# Patient Record
Sex: Female | Born: 1995 | Race: Black or African American | Hispanic: No | Marital: Single | Smoking: Current every day smoker
Health system: Southern US, Community
[De-identification: ages and names within clinical notes are randomized; demographics above are authoritative.]

## PROBLEM LIST (undated history)

## (undated) ENCOUNTER — Inpatient Hospital Stay (HOSPITAL_COMMUNITY): Payer: Self-pay

## (undated) ENCOUNTER — Emergency Department (HOSPITAL_COMMUNITY): Discharge status: Absent without leave | Payer: Self-pay

## (undated) ENCOUNTER — Emergency Department (HOSPITAL_COMMUNITY): Disposition: A | Discharge status: Home / Self Care | Payer: Self-pay

## (undated) DIAGNOSIS — O139 Gestational [pregnancy-induced] hypertension without significant proteinuria, unspecified trimester: Secondary | ICD-10-CM

## (undated) DIAGNOSIS — B999 Unspecified infectious disease: Secondary | ICD-10-CM

## (undated) DIAGNOSIS — F32A Depression, unspecified: Secondary | ICD-10-CM

## (undated) DIAGNOSIS — F329 Major depressive disorder, single episode, unspecified: Secondary | ICD-10-CM

## (undated) DIAGNOSIS — N390 Urinary tract infection, site not specified: Secondary | ICD-10-CM

## (undated) DIAGNOSIS — O1490 Unspecified pre-eclampsia, unspecified trimester: Secondary | ICD-10-CM

## (undated) HISTORY — PX: THERAPEUTIC ABORTION: SHX798

## (undated) HISTORY — PX: HERNIA REPAIR: SHX51

---
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
## 2014-01-14 ENCOUNTER — Encounter (HOSPITAL_COMMUNITY): Payer: Self-pay | Admitting: Emergency Medicine

## 2014-01-14 ENCOUNTER — Emergency Department (HOSPITAL_COMMUNITY): Payer: BC Managed Care – PPO

## 2014-01-14 ENCOUNTER — Emergency Department (HOSPITAL_COMMUNITY)
Admission: EM | Admit: 2014-01-14 | Discharge: 2014-01-15 | Disposition: A | Discharge status: Home / Self Care | Payer: BC Managed Care – PPO | Attending: Emergency Medicine | Admitting: Emergency Medicine

## 2014-01-14 DIAGNOSIS — F17200 Nicotine dependence, unspecified, uncomplicated: Secondary | ICD-10-CM | POA: Insufficient documentation

## 2014-01-14 DIAGNOSIS — Z880 Allergy status to penicillin: Secondary | ICD-10-CM | POA: Diagnosis not present

## 2014-01-14 DIAGNOSIS — R071 Chest pain on breathing: Secondary | ICD-10-CM | POA: Insufficient documentation

## 2014-01-14 DIAGNOSIS — R079 Chest pain, unspecified: Secondary | ICD-10-CM | POA: Diagnosis present

## 2014-01-14 DIAGNOSIS — R0789 Other chest pain: Secondary | ICD-10-CM

## 2014-01-14 LAB — CBC
HCT: 38 % (ref 36.0–46.0)
Hemoglobin: 12.7 g/dL (ref 12.0–15.0)
MCH: 29.1 pg (ref 26.0–34.0)
MCHC: 33.4 g/dL (ref 30.0–36.0)
MCV: 87 fL (ref 78.0–100.0)
Platelets: 232 10*3/uL (ref 150–400)
RBC: 4.37 MIL/uL (ref 3.87–5.11)
RDW: 13.5 % (ref 11.5–15.5)
WBC: 12 10*3/uL — ABNORMAL HIGH (ref 4.0–10.5)

## 2014-01-14 LAB — BASIC METABOLIC PANEL
BUN: 11 mg/dL (ref 6–23)
CALCIUM: 9.4 mg/dL (ref 8.4–10.5)
CO2: 22 mEq/L (ref 19–32)
Chloride: 103 mEq/L (ref 96–112)
Creatinine, Ser: 0.61 mg/dL (ref 0.50–1.10)
GFR calc Af Amer: >90 mL/min (ref >90)
Glucose, Bld: 94 mg/dL (ref 70–99)
Potassium: 4 mEq/L (ref 3.7–5.3)
SODIUM: 139 meq/L (ref 137–147)

## 2014-01-14 LAB — I-STAT TROPONIN, ED: Troponin i, poc: 0.01 ng/mL (ref 0.00–0.08)
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
## 2014-01-15 DIAGNOSIS — R071 Chest pain on breathing: Secondary | ICD-10-CM | POA: Diagnosis not present

## 2014-01-15 MED ORDER — IBUPROFEN 800 MG PO TABS
800.0000 mg | ORAL_TABLET | Freq: Once | ORAL | Status: AC
Start: 2014-01-15 — End: 2014-01-15
  Administered 2014-01-15: 800 mg via ORAL
  Filled 2014-01-15: qty 1

## 2014-01-15 MED ORDER — IBUPROFEN 800 MG PO TABS
800.0000 mg | ORAL_TABLET | Freq: Three times a day (TID) | ORAL | Status: DC | PRN
Start: 2014-01-15 — End: 2014-03-02

## 2014-01-15 NOTE — ED Provider Notes (Signed)
CSN: [id number]     Arrival date & time 01/14/14  2143 History   First MD Initiated Contact with Patient 01/14/14 2359     Chief Complaint  Patient presents with  . Chest Pain     (Consider location/radiation/quality/duration/timing/severity/associated sxs/prior Treatment) HPI 18 year old female presents to emergency room with complaint of central chest pain starting last night/this morning.  Pain is sharp, stabbing in nature.  It is worse with movement and palpation of the area.  It is also worse with taking a deep breath.  She denies any leg swelling or pain, no exogenous hormones, no prolonged immobilization.  No other respects occurs for DVT or PE.  Patient reports she has pain to her upper legs, but attributes it to dancing last night. Past Medical History  Diagnosis Date  . Medical history non-contributory   . Pre-eclampsia    Past Surgical History  Procedure Laterality Date  . Hernia repair    . Cesarean section N/A 06/21/2013    Procedure: CESAREAN SECTION;  Surgeon: Purcell NailsAngela Y Roberts, MD;  Location: [facility];  Service: Obstetrics;  Laterality: N/A;   Family History  Problem Relation Age of Onset  . Diabetes Mother   . Hyperlipidemia Mother   . Asthma Mother   . Hyperlipidemia Father   . Hypertension Father   . Cancer Maternal Aunt     Rectal  . Cancer Maternal Grandmother   . Kidney disease Maternal Grandmother    History  Substance Use Topics  . Smoking status: Current Every Day Smoker -- 0.25 packs/day    Types: Cigarettes  . Smokeless tobacco: Never Used  . Alcohol Use: No     Comment: Occasional   OB History   Grav Para Term Preterm Abortions TAB SAB Ect Mult Living   2 2 1       2      Review of Systems  See History of Present Illness; otherwise all other systems are reviewed and negative   Allergies  Penicillins  Home Medications   Prior to Admission medications   Medication Sig Start Date End Date Taking? Authorizing Provider  ibuprofen  (ADVIL,MOTRIN) 800 MG tablet Take 1 tablet (800 mg total) by mouth every 8 (eight) hours as needed for moderate pain. 01/15/14   Olivia Mackielga M Yates Weisgerber, MD   BP 108/78  Pulse 70  Temp(Src) 98.5 F (36.9 C) (Oral)  Resp 22  Ht 5\' 5"  (1.651 m)  Wt 190 lb (86.183 kg)  BMI 31.62 kg/m2  SpO2 100%  LMP 12/19/2013  Breastfeeding? No Physical Exam  Nursing note and vitals reviewed. Constitutional: She is oriented to person, place, and time. She appears well-developed and well-nourished.  HENT:  Head: Normocephalic and atraumatic.  Nose: Nose normal.  Mouth/Throat: Oropharynx is clear and moist.  Eyes: Conjunctivae and EOM are normal. Pupils are equal, round, and reactive to light.  Neck: Normal range of motion. Neck supple. No JVD present. No tracheal deviation present. No thyromegaly present.  Cardiovascular: Normal rate, regular rhythm, normal heart sounds and intact distal pulses.  Exam reveals no gallop and no friction rub.   No murmur heard. Pulmonary/Chest: Effort normal and breath sounds normal. No stridor. No respiratory distress. She has no wheezes. She has no rales. She exhibits tenderness (tenderness to palpation over sternum and costochondral margins.  Palpation of this area reproduces the pain).  Abdominal: Soft. Bowel sounds are normal. She exhibits no distension and no mass. There is no tenderness. There is no rebound and no guarding.  Musculoskeletal: Normal range of motion. She exhibits no edema and no tenderness.  Lymphadenopathy:    She has no cervical adenopathy.  Neurological: She is alert and oriented to person, place, and time. She exhibits normal muscle tone. Coordination normal.  Skin: Skin is warm and dry. No rash noted. No erythema. No pallor.  Psychiatric: She has a normal mood and affect. Her behavior is normal. Judgment and thought content normal.    ED Course  Procedures (including critical care time) Labs Review Labs Reviewed  CBC - Abnormal; Notable for the  following:    WBC 12.0 (*)    All other components within normal limits  BASIC METABOLIC PANEL  I-STAT TROPOININ, ED    Imaging Review Dg Chest 2 View  01/14/2014   CLINICAL DATA:  Chest pain worsening in the past 24 hr.  EXAM: CHEST  2 VIEW  COMPARISON:  09/21/2013.  FINDINGS: The heart size and mediastinal contours are within normal limits. Both lungs are clear. The visualized skeletal structures are unremarkable.  IMPRESSION: No active cardiopulmonary disease.   Electronically Signed   By: Davonna Belling M.D.   On: 01/14/2014 23:18     EKG Interpretation   Date/Time:  Sunday January 14 2014 21:51:45 EDT Ventricular Rate:  93 PR Interval:  138 QRS Duration: 80 QT Interval:  346 QTC Calculation: 430 R Axis:   72 Text Interpretation:  Normal sinus rhythm Normal ECG Confirmed by Doren Kaspar   MD, Maylani Embree ([id number]) on 01/15/2014 12:17:44 AM      MDM   Final diagnoses:  Chest wall pain    18 year old female with chest wall pain.  Workup otherwise unremarkable.  Plan to treat with NSAIDs.  Patient is per negative.  Do not feel symptoms are due to PE.    Olivia Mackie, MD 01/15/14 [phone]

## 2014-01-15 NOTE — ED Notes (Signed)
Discharge instructions reviewed with pt. Pt verbalized understanding.   

## 2014-01-15 NOTE — Discharge Instructions (Signed)
Chest Wall Pain  Chest wall pain is pain in or around the bones and muscles of your chest. It may take up to 6 weeks to get better. It may take longer if you must stay physically active in your work and activities.   CAUSES   Chest wall pain may happen on its own. However, it may be caused by:  · A viral illness like the flu.  · Injury.  · Coughing.  · Exercise.  · Arthritis.  · Fibromyalgia.  · Shingles.  HOME CARE INSTRUCTIONS   · Avoid overtiring physical activity. Try not to strain or perform activities that cause pain. This includes any activities using your chest or your abdominal and side muscles, especially if heavy weights are used.  · Put ice on the sore area.  · Put ice in a plastic bag.  · Place a towel between your skin and the bag.  · Leave the ice on for 15-20 minutes per hour while awake for the first 2 days.  · Only take over-the-counter or prescription medicines for pain, discomfort, or fever as directed by your caregiver.  SEEK IMMEDIATE MEDICAL CARE IF:   · Your pain increases, or you are very uncomfortable.  · You have a fever.  · Your chest pain becomes worse.  · You have new, unexplained symptoms.  · You have nausea or vomiting.  · You feel sweaty or lightheaded.  · You have a cough with phlegm (sputum), or you cough up blood.  MAKE SURE YOU:   · Understand these instructions.  · Will watch your condition.  · Will get help right away if you are not doing well or get worse.  Document Released: 07/27/2005 Document Revised: 10/19/2011 Document Reviewed: 03/23/2011  ExitCare® Patient Information ©2014 [employer].    Costochondritis  Costochondritis is a condition in which the tissue (cartilage) that connects your ribs with your breastbone (sternum) becomes irritated. It causes pain in the chest and rib area. It usually goes away on its own over time.  HOME CARE  · Avoid activities that wear you out.  · Do not strain your ribs. Avoid activities that use your:  · Chest.  · Belly.  · Side  muscles.  · Put ice on the area for the first 2 days after the pain starts.  · Put ice in a plastic bag.  · Place a towel between your skin and the bag.  · Leave the ice on for 20 minutes, 2 3 times a day.  · Only take medicine as told by your doctor.  GET HELP IF:  · You have redness or puffiness (swelling) in the rib area.  · Your pain does not go away with rest or medicine.  GET HELP RIGHT AWAY IF:   · Your pain gets worse.  · You are very uncomfortable.  · You have trouble breathing.  · You cough up blood.  · You start sweating or throwing up (vomiting).  · You have a fever or lasting symptoms for more than 2 3 days.  · You have a fever and your symptoms suddenly get worse.  MAKE SURE YOU:   · Understand these instructions.  · Will watch your condition.  · Will get help right away if you are not doing well or get worse.  Document Released: 01/13/2008 Document Revised: 03/29/2013 Document Reviewed: 02/28/2013  ExitCare® Patient Information ©2014 [employer].

## 2014-01-15 NOTE — ED Notes (Signed)
Pt reports sternal cp described as sharp stabbing in nature. Pt reports pain started yesterday but got worse today. Pt states pain is worse with deep inhalation. Pt smokes cigarettes. Reports she did not take anything for her pain. Pt rates pain 8/10. Pt looks comfortable at this time, currently on phone.

## 2014-03-02 ENCOUNTER — Encounter (HOSPITAL_COMMUNITY): Payer: Self-pay | Admitting: Emergency Medicine

## 2014-03-02 ENCOUNTER — Emergency Department (HOSPITAL_COMMUNITY)
Admission: EM | Admit: 2014-03-02 | Discharge: 2014-03-02 | Disposition: A | Discharge status: Home / Self Care | Payer: BC Managed Care – PPO | Attending: Emergency Medicine | Admitting: Emergency Medicine

## 2014-03-02 DIAGNOSIS — N760 Acute vaginitis: Secondary | ICD-10-CM | POA: Diagnosis not present

## 2014-03-02 DIAGNOSIS — Z880 Allergy status to penicillin: Secondary | ICD-10-CM | POA: Insufficient documentation

## 2014-03-02 DIAGNOSIS — B9689 Other specified bacterial agents as the cause of diseases classified elsewhere: Secondary | ICD-10-CM | POA: Insufficient documentation

## 2014-03-02 DIAGNOSIS — Z3202 Encounter for pregnancy test, result negative: Secondary | ICD-10-CM | POA: Insufficient documentation

## 2014-03-02 DIAGNOSIS — F17200 Nicotine dependence, unspecified, uncomplicated: Secondary | ICD-10-CM | POA: Insufficient documentation

## 2014-03-02 DIAGNOSIS — A64 Unspecified sexually transmitted disease: Secondary | ICD-10-CM | POA: Insufficient documentation

## 2014-03-02 DIAGNOSIS — A499 Bacterial infection, unspecified: Secondary | ICD-10-CM | POA: Diagnosis not present

## 2014-03-02 LAB — URINALYSIS, ROUTINE W REFLEX MICROSCOPIC
BILIRUBIN URINE: NEGATIVE
Glucose, UA: NEGATIVE mg/dL
Hgb urine dipstick: NEGATIVE
KETONES UR: NEGATIVE mg/dL
Leukocytes, UA: NEGATIVE
Nitrite: NEGATIVE
PH: 7 (ref 5.0–8.0)
Protein, ur: NEGATIVE mg/dL
Specific Gravity, Urine: 1.018 (ref 1.005–1.030)
Urobilinogen, UA: 1 mg/dL (ref 0.0–1.0)

## 2014-03-02 LAB — WET PREP, GENITAL
Trich, Wet Prep: NONE SEEN
YEAST WET PREP: NONE SEEN

## 2014-03-02 LAB — POC URINE PREG, ED: Preg Test, Ur: NEGATIVE

## 2014-03-02 MED ORDER — METRONIDAZOLE 500 MG PO TABS: 500.0000 mg | ORAL_TABLET | Freq: Two times a day (BID) | ORAL | Status: DC

## 2014-03-02 NOTE — ED Notes (Signed)
Pt in vaginal burning and itching, thinks she has been exposed to an STD. No distress noted.

## 2014-03-02 NOTE — ED Notes (Signed)
Pt alert, NAD, calm, interactive, texting on cell phone at this time, [facility]

## 2014-03-02 NOTE — ED Notes (Signed)
Dr. Delo into room.  

## 2014-03-02 NOTE — ED Provider Notes (Signed)
CSN: [id number]     Arrival date & time 03/02/14  1649 History   First MD Initiated Contact with Patient 03/02/14 1943     Chief Complaint  Patient presents with  . SEXUALLY TRANSMITTED DISEASE     (Consider location/radiation/quality/duration/timing/severity/associated sxs/prior Treatment) HPI Comments: Patient is a [age] year old female who presents with complaints of vaginal discharge and vaginal burning. She is sexually active with one other partner in several times has not used protection. Her last menstrual period was last month and normal. She is concerned she may have a sexually transmitted disease.  Patient is a 18 y.o. female presenting with vaginal discharge. The history is provided by the patient.  Vaginal Discharge Quality:  Mucopurulent Severity:  Moderate Onset quality:  Sudden Duration:  2 days Timing:  Constant Progression:  Worsening Chronicity:  New Context: not after intercourse   Relieved by:  Nothing Worsened by:  Nothing tried Ineffective treatments:  None tried Associated symptoms: no abdominal pain     Past Medical History  Diagnosis Date  . Medical history non-contributory   . Pre-eclampsia    Past Surgical History  Procedure Laterality Date  . Hernia repair    . Cesarean section N/A 06/21/2013    Procedure: CESAREAN SECTION;  Surgeon: Purcell NailsAngela Y Roberts, MD;  Location: [facility];  Service: Obstetrics;  Laterality: N/A;   Family History  Problem Relation Age of Onset  . Diabetes Mother   . Hyperlipidemia Mother   . Asthma Mother   . Hyperlipidemia Father   . Hypertension Father   . Cancer Maternal Aunt     Rectal  . Cancer Maternal Grandmother   . Kidney disease Maternal Grandmother    History  Substance Use Topics  . Smoking status: Current Every Day Smoker -- 0.25 packs/day    Types: Cigarettes  . Smokeless tobacco: Never Used  . Alcohol Use: No     Comment: Occasional   OB History   Grav Para Term Preterm Abortions TAB SAB Ect Mult  Living   2 2 1       2      Review of Systems  Gastrointestinal: Negative for abdominal pain.  Genitourinary: Positive for vaginal discharge.  All other systems reviewed and are negative.     Allergies  Penicillins  Home Medications   Prior to Admission medications   Not on File   BP 112/79  Pulse 85  Temp(Src) 98.2 F (36.8 C) (Oral)  Resp 20  SpO2 100% Physical Exam  Nursing note and vitals reviewed. Constitutional: She is oriented to person, place, and time. She appears well-developed and well-nourished. No distress.  HENT:  Head: Normocephalic and atraumatic.  Mouth/Throat: Oropharynx is clear and moist.  Neck: Normal range of motion. Neck supple.  Abdominal: Soft. Bowel sounds are normal. She exhibits no distension. There is no tenderness.  Genitourinary:  There is a copious, whitish discharge present. There are no adnexal masses and no cervical motion tenderness. The external genitalia appears normal without lesions.  Musculoskeletal: Normal range of motion. She exhibits no edema.  Neurological: She is alert and oriented to person, place, and time.  Skin: Skin is warm and dry. She is not diaphoretic.    ED Course  Procedures (including critical care time) Labs Review Labs Reviewed  GC/CHLAMYDIA PROBE AMP  WET PREP, GENITAL  URINALYSIS, ROUTINE W REFLEX MICROSCOPIC  PREGNANCY, URINE  POC URINE PREG, ED    Imaging Review No results found.   EKG Interpretation None  MDM   Final diagnoses:  None    Pelvic exam reveals copious whitish discharge. Wet prep reveals many clue cells consistent with bacterial vaginosis. We'll treat with Flagyl. GC and Chlamydia cultures are pending. If these are positive we will call her and notify her of her need for further treatment.    Geoffery Lyons, MD 03/02/14 [phone]

## 2014-03-02 NOTE — Discharge Instructions (Signed)
Flagyl as prescribed. ° °We will call you if your cultures indicate that you require further treatment. ° ° °Bacterial Vaginosis °Bacterial vaginosis is a vaginal infection that occurs when the normal balance of bacteria in the vagina is disrupted. It results from an overgrowth of certain bacteria. This is the most common vaginal infection in women of childbearing age. Treatment is important to prevent complications, especially in pregnant women, as it can cause a premature delivery. °CAUSES  °Bacterial vaginosis is caused by an increase in harmful bacteria that are normally present in smaller amounts in the vagina. Several different kinds of bacteria can cause bacterial vaginosis. However, the reason that the condition develops is not fully understood. °RISK FACTORS °Certain activities or behaviors can put you at an increased risk of developing bacterial vaginosis, including: °· Having a new sex partner or multiple sex partners. °· Douching. °· Using an intrauterine device (IUD) for contraception. °Women do not get bacterial vaginosis from toilet seats, bedding, swimming pools, or contact with objects around them. °SIGNS AND SYMPTOMS  °Some women with bacterial vaginosis have no signs or symptoms. Common symptoms include: °· Grey vaginal discharge. °· A fishlike odor with discharge, especially after sexual intercourse. °· Itching or burning of the vagina and vulva. °· Burning or pain with urination. °DIAGNOSIS  °Your health care provider will take a medical history and examine the vagina for signs of bacterial vaginosis. A sample of vaginal fluid may be taken. Your health care provider will look at this sample under a microscope to check for bacteria and abnormal cells. A vaginal pH test may also be done.  °TREATMENT  °Bacterial vaginosis may be treated with antibiotic medicines. These may be given in the form of a pill or a vaginal cream. A second round of antibiotics may be prescribed if the condition comes back  after treatment.  °HOME CARE INSTRUCTIONS  °· Only take over-the-counter or prescription medicines as directed by your health care provider. °· If antibiotic medicine was prescribed, take it as directed. Make sure you finish it even if you start to feel better. °· Do not have sex until treatment is completed. °· Tell all sexual partners that you have a vaginal infection. They should see their health care provider and be treated if they have problems, such as a mild rash or itching. °· Practice safe sex by using condoms and only having one sex partner. °SEEK MEDICAL CARE IF:  °· Your symptoms are not improving after 3 days of treatment. °· You have increased discharge or pain. °· You have a fever. °MAKE SURE YOU:  °· Understand these instructions. °· Will watch your condition. °· Will get help right away if you are not doing well or get worse. °FOR MORE INFORMATION  °Centers for Disease Control and Prevention, Division of STD Prevention: www.cdc.gov/std °American Sexual Health Association (ASHA): www.ashastd.org  °Document Released: 07/27/2005 Document Revised: 05/17/2013 Document Reviewed: 03/08/2013 °ExitCare® Patient Information ©2015 [employer]. This information is not intended to replace advice given to you by your health care provider. Make sure you discuss any questions you have with your health care provider. ° °

## 2014-03-02 NOTE — ED Notes (Signed)
Pelvic Cart Set up (Ready)

## 2014-03-03 LAB — GC/CHLAMYDIA PROBE AMP
CT Probe RNA: NEGATIVE
GC Probe RNA: NEGATIVE

## 2014-03-29 ENCOUNTER — Emergency Department (HOSPITAL_COMMUNITY)
Admission: EM | Admit: 2014-03-29 | Discharge: 2014-03-29 | Disposition: A | Discharge status: Home / Self Care | Payer: BC Managed Care – PPO | Attending: Emergency Medicine | Admitting: Emergency Medicine

## 2014-03-29 ENCOUNTER — Encounter (HOSPITAL_COMMUNITY): Payer: Self-pay | Admitting: Emergency Medicine

## 2014-03-29 DIAGNOSIS — Z880 Allergy status to penicillin: Secondary | ICD-10-CM | POA: Insufficient documentation

## 2014-03-29 DIAGNOSIS — J029 Acute pharyngitis, unspecified: Secondary | ICD-10-CM | POA: Diagnosis not present

## 2014-03-29 DIAGNOSIS — R05 Cough: Secondary | ICD-10-CM | POA: Insufficient documentation

## 2014-03-29 DIAGNOSIS — R059 Cough, unspecified: Secondary | ICD-10-CM | POA: Insufficient documentation

## 2014-03-29 DIAGNOSIS — J3489 Other specified disorders of nose and nasal sinuses: Secondary | ICD-10-CM | POA: Diagnosis not present

## 2014-03-29 DIAGNOSIS — Z792 Long term (current) use of antibiotics: Secondary | ICD-10-CM | POA: Insufficient documentation

## 2014-03-29 DIAGNOSIS — F17200 Nicotine dependence, unspecified, uncomplicated: Secondary | ICD-10-CM | POA: Insufficient documentation

## 2014-03-29 LAB — RAPID STREP SCREEN (MED CTR MEBANE ONLY): Streptococcus, Group A Screen (Direct): NEGATIVE

## 2014-03-29 MED ORDER — HYDROCODONE-HOMATROPINE 5-1.5 MG/5ML PO SYRP: 5.0000 mL | ORAL_SOLUTION | Freq: Four times a day (QID) | ORAL | Status: DC | PRN

## 2014-03-29 NOTE — Discharge Instructions (Signed)
Please follow up with your primary care physician in 1-2 days. If you do not have one please call the [facility] number listed above. Please take pain medication and/or muscle relaxants as prescribed and as needed for pain. Please do not drive on narcotic pain medication or on muscle relaxants. Please read all discharge instructions and return precautions.  ° ° °Pharyngitis °Pharyngitis is redness, pain, and swelling (inflammation) of your pharynx.  °CAUSES  °Pharyngitis is usually caused by infection. Most of the time, these infections are from viruses (viral) and are part of a cold. However, sometimes pharyngitis is caused by bacteria (bacterial). Pharyngitis can also be caused by allergies. Viral pharyngitis may be spread from person to person by coughing, sneezing, and personal items or utensils (cups, forks, spoons, toothbrushes). Bacterial pharyngitis may be spread from person to person by more intimate contact, such as kissing.  °SIGNS AND SYMPTOMS  °Symptoms of pharyngitis include:   °· Sore throat.   °· Tiredness (fatigue).   °· Low-grade fever.   °· Headache. °· Joint pain and muscle aches. °· Skin rashes. °· Swollen lymph nodes. °· Plaque-like film on throat or tonsils (often seen with bacterial pharyngitis). °DIAGNOSIS  °Your health care provider will ask you questions about your illness and your symptoms. Your medical history, along with a physical exam, is often all that is needed to diagnose pharyngitis. Sometimes, a rapid strep test is done. Other lab tests may also be done, depending on the suspected cause.  °TREATMENT  °Viral pharyngitis will usually get better in 3-4 days without the use of medicine. Bacterial pharyngitis is treated with medicines that kill germs (antibiotics).  °HOME CARE INSTRUCTIONS  °· Drink enough water and fluids to keep your urine clear or pale yellow.   °· Only take over-the-counter or prescription medicines as directed by your health care provider:    °¨ If you are prescribed antibiotics, make sure you finish them even if you start to feel better.   °¨ Do not take aspirin.   °· Get lots of rest.   °· Gargle with 8 oz of salt water (½ tsp of salt per 1 qt of water) as often as every 1-2 hours to soothe your throat.   °· Throat lozenges (if you are not at risk for choking) or sprays may be used to soothe your throat. °SEEK MEDICAL CARE IF:  °· You have large, tender lumps in your neck. °· You have a rash. °· You cough up green, yellow-brown, or bloody spit. °SEEK IMMEDIATE MEDICAL CARE IF:  °· Your neck becomes stiff. °· You drool or are unable to swallow liquids. °· You vomit or are unable to keep medicines or liquids down. °· You have severe pain that does not go away with the use of recommended medicines. °· You have trouble breathing (not caused by a stuffy nose). °MAKE SURE YOU:  °· Understand these instructions. °· Will watch your condition. °· Will get help right away if you are not doing well or get worse. °Document Released: 07/27/2005 Document Revised: 05/17/2013 Document Reviewed: 04/03/2013 °ExitCare® Patient Information ©2015 [employer]. This information is not intended to replace advice given to you by your health care provider. Make sure you discuss any questions you have with your health care provider. ° °

## 2014-03-29 NOTE — ED Provider Notes (Signed)
Medical screening examination/treatment/procedure(s) were performed by non-physician practitioner and as supervising physician I was immediately available for consultation/collaboration.  Flint MelterElliott L Tracy Gerken, MD 03/29/14 [phone]

## 2014-03-29 NOTE — ED Provider Notes (Signed)
CSN: [id number]     Arrival date & time 03/29/14  0024 History   None    Chief Complaint  Patient presents with  . Sore Throat     (Consider location/radiation/quality/duration/timing/severity/associated sxs/prior Treatment) Patient is a 18 y.o. female presenting with pharyngitis.  Sore Throat This is a new problem. The current episode started yesterday. The problem occurs constantly. The problem has been unchanged. Associated symptoms include congestion, coughing and a sore throat. Pertinent negatives include no abdominal pain, anorexia, chest pain, chills, fatigue, fever, headaches, joint swelling, myalgias, nausea, neck pain or vomiting. The symptoms are aggravated by eating. She has tried acetaminophen for the symptoms. The treatment provided mild relief.    Past Medical History  Diagnosis Date  . Medical history non-contributory   . Pre-eclampsia    Past Surgical History  Procedure Laterality Date  . Hernia repair    . Cesarean section N/A 06/21/2013    Procedure: CESAREAN SECTION;  Surgeon: Purcell Nails, MD;  Location: [facility];  Service: Obstetrics;  Laterality: N/A;   Family History  Problem Relation Age of Onset  . Diabetes Mother   . Hyperlipidemia Mother   . Asthma Mother   . Hyperlipidemia Father   . Hypertension Father   . Cancer Maternal Aunt     Rectal  . Cancer Maternal Grandmother   . Kidney disease Maternal Grandmother    History  Substance Use Topics  . Smoking status: Current Every Day Smoker -- 0.25 packs/day    Types: Cigarettes  . Smokeless tobacco: Never Used  . Alcohol Use: No     Comment: Occasional   OB History   Grav Para Term Preterm Abortions TAB SAB Ect Mult Living   2 2 1       2      Review of Systems  Constitutional: Negative for fever, chills and fatigue.  HENT: Positive for congestion and sore throat.   Respiratory: Positive for cough.   Cardiovascular: Negative for chest pain.  Gastrointestinal: Negative for nausea,  vomiting, abdominal pain and anorexia.  Musculoskeletal: Negative for joint swelling, myalgias and neck pain.  Neurological: Negative for headaches.  All other systems reviewed and are negative.     Allergies  Penicillins  Home Medications   Prior to Admission medications   Medication Sig Start Date End Date Taking? Authorizing Provider  HYDROcodone-homatropine (HYCODAN) 5-1.5 MG/5ML syrup Take 5 mLs by mouth every 6 (six) hours as needed for cough. 03/29/14   Ife Vitelli L Toini Failla, PA-C  metroNIDAZOLE (FLAGYL) 500 MG tablet Take 1 tablet (500 mg total) by mouth 2 (two) times daily. One po bid x 7 days 03/02/14   Geoffery Lyons, MD   BP 136/90  Pulse 89  Temp(Src) 97.9 F (36.6 C) (Oral)  Resp 14  Ht 5\' 5"  (1.651 m)  Wt 179 lb (81.194 kg)  BMI 29.79 kg/m2  SpO2 100%  LMP 03/14/2014 Physical Exam  Nursing note and vitals reviewed. Constitutional: She is oriented to person, place, and time. She appears well-developed and well-nourished. No distress.  HENT:  Head: Normocephalic and atraumatic.  Right Ear: External ear normal.  Left Ear: External ear normal.  Nose: Nose normal.  Mouth/Throat: Uvula is midline and mucous membranes are normal. Posterior oropharyngeal erythema present. No oropharyngeal exudate, posterior oropharyngeal edema or tonsillar abscesses.  Eyes: Conjunctivae are normal.  Neck: Normal range of motion. Neck supple.  Cardiovascular: Normal rate, regular rhythm and normal heart sounds.   Pulmonary/Chest: Effort normal and breath sounds normal. No  respiratory distress.  Abdominal: Soft.  Musculoskeletal: Normal range of motion.  Lymphadenopathy:    She has cervical adenopathy.  Neurological: She is alert and oriented to person, place, and time.  Skin: Skin is warm and dry. She is not diaphoretic.  Psychiatric: She has a normal mood and affect.    ED Course  Procedures (including critical care time) Medications - No data to display  Labs Review Labs  Reviewed  RAPID STREP SCREEN  CULTURE, GROUP A STREP    Imaging Review No results found.   EKG Interpretation None      MDM   Final diagnoses:  Viral pharyngitis    Filed Vitals:   03/29/14 0333  BP: 136/90  Pulse: 89  Temp:   Resp: 14   Afebrile, NAD, non-toxic appearing, AAOx4.  Pt afebrile without tonsillar exudate, negative strep. Presents with mild cervical lymphadenopathy, & dysphagia; diagnosis of viral pharyngitis. No abx indicated. DC w symptomatic tx for pain  Pt does not appear dehydrated, but did discuss importance of water rehydration. Presentation non concerning for PTA or infxn spread to soft tissue. No trismus or uvula deviation. Specific return precautions discussed. Pt able to drink water in ED without difficulty with intact air way. Recommended PCP follow up.Patient is stable at time of discharge      Jeannetta EllisJennifer L Kassandra Meriweather, PA-C 03/29/14 [phone]

## 2014-03-29 NOTE — ED Notes (Signed)
Pt states she is leaving.  Informed pt that she is next to go to a treatment room and encouraged her to stay.  She states she will wait.

## 2014-03-29 NOTE — ED Notes (Signed)
Sore throat x 2 days; difficulty swallowing

## 2014-03-30 LAB — CULTURE, GROUP A STREP

## 2014-06-11 ENCOUNTER — Encounter (HOSPITAL_COMMUNITY): Payer: Self-pay | Admitting: Emergency Medicine

## 2014-06-25 ENCOUNTER — Encounter (HOSPITAL_COMMUNITY): Payer: Self-pay | Admitting: Emergency Medicine

## 2014-06-25 ENCOUNTER — Other Ambulatory Visit (HOSPITAL_COMMUNITY)
Admission: RE | Admit: 2014-06-25 | Discharge: 2014-06-25 | Disposition: A | Discharge status: Home / Self Care | Payer: BC Managed Care – PPO | Source: Ambulatory Visit | Attending: Family Medicine | Admitting: Family Medicine

## 2014-06-25 ENCOUNTER — Emergency Department (INDEPENDENT_AMBULATORY_CARE_PROVIDER_SITE_OTHER)
Admission: EM | Admit: 2014-06-25 | Discharge: 2014-06-25 | Disposition: A | Discharge status: Home / Self Care | Payer: BC Managed Care – PPO | Source: Home / Self Care | Attending: Family Medicine | Admitting: Family Medicine

## 2014-06-25 DIAGNOSIS — K625 Hemorrhage of anus and rectum: Secondary | ICD-10-CM

## 2014-06-25 DIAGNOSIS — N760 Acute vaginitis: Secondary | ICD-10-CM | POA: Insufficient documentation

## 2014-06-25 DIAGNOSIS — Z113 Encounter for screening for infections with a predominantly sexual mode of transmission: Secondary | ICD-10-CM | POA: Insufficient documentation

## 2014-06-25 LAB — POCT URINALYSIS DIP (DEVICE)
BILIRUBIN URINE: NEGATIVE
Glucose, UA: NEGATIVE mg/dL
Ketones, ur: NEGATIVE mg/dL
LEUKOCYTES UA: NEGATIVE
Nitrite: NEGATIVE
PH: 6 (ref 5.0–8.0)
Protein, ur: NEGATIVE mg/dL
Specific Gravity, Urine: >=1.03 (ref 1.005–1.030)
Urobilinogen, UA: 0.2 mg/dL (ref 0.0–1.0)

## 2014-06-25 LAB — POCT PREGNANCY, URINE: PREG TEST UR: NEGATIVE

## 2014-06-25 MED ORDER — METRONIDAZOLE 500 MG PO TABS: 500.0000 mg | ORAL_TABLET | Freq: Two times a day (BID) | ORAL | Status: DC

## 2014-06-25 NOTE — ED Notes (Signed)
C/o vaginal itching and rectal bleeding onset 4 days Reports rectal bleeding is only when she has a BM Sx also include abd pain and diarrhea x2 days LMP = 05/14/2014 Alert, no signs of acute distress.

## 2014-06-25 NOTE — ED Provider Notes (Addendum)
Natalie Dorothy PufferDanielle Chavez is a 18 y.o. female who presents to Urgent Care today for Vaginal irritation and rectal bleeding present for about 4 days. Clear discharge present. No fevers or chills nausea vomiting or diarrhea. No medications tried yet. Patient notes blood on the paper after having bowel movements. She's been having diarrhea recently and going to the bathroom a lot.   Past Medical History  Diagnosis Date  . Medical history non-contributory   . Pre-eclampsia    Past Surgical History  Procedure Laterality Date  . Hernia repair    . Cesarean section N/A 06/21/2013    Procedure: CESAREAN SECTION;  Surgeon: Purcell NailsAngela Y Roberts, MD;  Location: [facility];  Service: Obstetrics;  Laterality: N/A;   History  Substance Use Topics  . Smoking status: Current Every Day Smoker -- 0.25 packs/day    Types: Cigarettes  . Smokeless tobacco: Never Used  . Alcohol Use: No     Comment: Occasional   ROS as above Medications: No current facility-administered medications for this encounter.   Current Outpatient Prescriptions  Medication Sig Dispense Refill  . metroNIDAZOLE (FLAGYL) 500 MG tablet Take 1 tablet (500 mg total) by mouth 2 (two) times daily. One po bid x 7 days 14 tablet 0   Allergies  Allergen Reactions  . Penicillins Hives     Exam:  BP 110/78 mmHg  Pulse 80  Temp(Src) 98.2 F (36.8 C) (Oral)  Resp 14  SpO2 100%  LMP 05/14/2014 Gen: Well NAD HEENT: EOMI,  MMM Lungs: Normal work of breathing. CTABL Heart: RRR no MRG Abd: NABS, Soft. Nondistended, Nontender no rebound or guarding Exts: Brisk capillary refill, warm and well perfused. GYN:  Normal-appearing external genitalia. Vaginal canal with thin white discharge. Normal-appearing cervix. Rectum. Normal anus. No hemorrhoids. Nontender.  Results for orders placed or performed during the hospital encounter of 06/25/14 (from the past 24 hour(s))  POCT urinalysis dip (device)     Status: Abnormal   Collection Time: 06/25/14   6:41 PM  Result Value Ref Range   Glucose, UA NEGATIVE NEGATIVE mg/dL   Bilirubin Urine NEGATIVE NEGATIVE   Ketones, ur NEGATIVE NEGATIVE mg/dL   Specific Gravity, Urine >=1.030 1.005 - 1.030   Hgb urine dipstick TRACE (A) NEGATIVE   pH 6.0 5.0 - 8.0   Protein, ur NEGATIVE NEGATIVE mg/dL   Urobilinogen, UA 0.2 0.0 - 1.0 mg/dL   Nitrite NEGATIVE NEGATIVE   Leukocytes, UA NEGATIVE NEGATIVE  Pregnancy, urine POC     Status: None   Collection Time: 06/25/14  6:51 PM  Result Value Ref Range   Preg Test, Ur NEGATIVE NEGATIVE   No results found.  Assessment and Plan: 18 y.o. female with  1) vaginal discharge. Likely BV. Cytology pending treatment with metronidazole 2) active bleeding. Likely irritated rectal mucosa. Recommend follow-up with PCP. Recommended wet wipes.  Discussed warning signs or symptoms. Please see discharge instructions. Patient expresses understanding.     Rodolph BongEvan S Corey, MD 06/25/14 [phone]  Rodolph BongEvan S Corey, MD 06/25/14 [phone]

## 2014-06-25 NOTE — Discharge Instructions (Signed)
Thank you for coming in today. Take metronidazole twice daily Come back as needed Use wet wipes after bowel movements.  Follow-up with primary care provider Call or go to the emergency room if you get worse, have trouble breathing, have chest pains, or palpitations.    Bacterial Vaginosis Bacterial vaginosis is a vaginal infection that occurs when the normal balance of bacteria in the vagina is disrupted. It results from an overgrowth of certain bacteria. This is the most common vaginal infection in women of childbearing age. Treatment is important to prevent complications, especially in pregnant women, as it can cause a premature delivery. CAUSES  Bacterial vaginosis is caused by an increase in harmful bacteria that are normally present in smaller amounts in the vagina. Several different kinds of bacteria can cause bacterial vaginosis. However, the reason that the condition develops is not fully understood. RISK FACTORS Certain activities or behaviors can put you at an increased risk of developing bacterial vaginosis, including:  Having a new sex partner or multiple sex partners.  Douching.  Using an intrauterine device (IUD) for contraception. Women do not get bacterial vaginosis from toilet seats, bedding, swimming pools, or contact with objects around them. SIGNS AND SYMPTOMS  Some women with bacterial vaginosis have no signs or symptoms. Common symptoms include:  Grey vaginal discharge.  A fishlike odor with discharge, especially after sexual intercourse.  Itching or burning of the vagina and vulva.  Burning or pain with urination. DIAGNOSIS  Your health care provider will take a medical history and examine the vagina for signs of bacterial vaginosis. A sample of vaginal fluid may be taken. Your health care provider will look at this sample under a microscope to check for bacteria and abnormal cells. A vaginal pH test may also be done.  TREATMENT  Bacterial vaginosis may be  treated with antibiotic medicines. These may be given in the form of a pill or a vaginal cream. A second round of antibiotics may be prescribed if the condition comes back after treatment.  HOME CARE INSTRUCTIONS   Only take over-the-counter or prescription medicines as directed by your health care provider.  If antibiotic medicine was prescribed, take it as directed. Make sure you finish it even if you start to feel better.  Do not have sex until treatment is completed.  Tell all sexual partners that you have a vaginal infection. They should see their health care provider and be treated if they have problems, such as a mild rash or itching.  Practice safe sex by using condoms and only having one sex partner. SEEK MEDICAL CARE IF:   Your symptoms are not improving after 3 days of treatment.  You have increased discharge or pain.  You have a fever. MAKE SURE YOU:   Understand these instructions.  Will watch your condition.  Will get help right away if you are not doing well or get worse. FOR MORE INFORMATION  Centers for Disease Control and Prevention, Division of STD Prevention: SolutionApps.co.zawww.cdc.gov/std American Sexual Health Association (ASHA): www.ashastd.org  Document Released: 07/27/2005 Document Revised: 05/17/2013 Document Reviewed: 03/08/2013 [facility] Patient Information 2015 [place], [place]. This information is not intended to replace advice given to you by your health care provider. Make sure you discuss any questions you have with your health care provider.   Rectal Bleeding Rectal bleeding is when blood passes out of the anus. It is usually a sign that something is wrong. It may not be serious, but it should always be evaluated. Rectal bleeding may  present as bright red blood or extremely dark stools. The color may range from dark red or maroon to black (like tar). It is important that the cause of rectal bleeding be identified so treatment can be started and the problem  corrected. CAUSES   Hemorrhoids. These are enlarged (dilated) blood vessels or veins in the anal or rectal area.  Fistulas. Theseare abnormal, burrowing channels that usually run from inside the rectum to the skin around the anus. They can bleed.  Anal fissures. This is a tear in the tissue of the anus. Bleeding occurs with bowel movements.  Diverticulosis. This is a condition in which pockets or sacs project from the bowel wall. Occasionally, the sacs can bleed.  Diverticulitis. Thisis an infection involving diverticulosis of the colon.  Proctitis and colitis. These are conditions in which the rectum, colon, or both, can become inflamed and pitted (ulcerated).  Polyps and cancer. Polyps are non-cancerous (benign) growths in the colon that may bleed. Certain types of polyps turn into cancer.  Protrusion of the rectum. Part of the rectum can project from the anus and bleed.  Certain medicines.  Intestinal infections.  Blood vessel abnormalities. HOME CARE INSTRUCTIONS  Eat a high-fiber diet to keep your stool soft.  Limit activity.  Drink enough fluids to keep your urine clear or pale yellow.  Warm baths may be useful to soothe rectal pain.  Follow up with your caregiver as directed. SEEK IMMEDIATE MEDICAL CARE IF:  You develop increased bleeding.  You have black or dark red stools.  You vomit blood or material that looks like coffee grounds.  You have abdominal pain or tenderness.  You have a fever.  You feel weak, nauseous, or you faint.  You have severe rectal pain or you are unable to have a bowel movement. MAKE SURE YOU:  Understand these instructions.  Will watch your condition.  Will get help right away if you are not doing well or get worse. Document Released: 01/16/2002 Document Revised: 10/19/2011 Document Reviewed: 01/11/2011 [facility]ExitCare Patient Information 2015 [place]inaiExitCare, [place]LL[place]. This information is not intended to replace advice given to you by your  health care provider. Make sure you discuss any questions you have with your health care provider.

## 2014-06-26 LAB — CERVICOVAGINAL ANCILLARY ONLY
Chlamydia: NEGATIVE
Neisseria Gonorrhea: NEGATIVE

## 2014-06-27 LAB — CERVICOVAGINAL ANCILLARY ONLY
WET PREP (BD AFFIRM): NEGATIVE
Wet Prep (BD Affirm): NEGATIVE
Wet Prep (BD Affirm): NEGATIVE

## 2014-09-05 DIAGNOSIS — Z720 Tobacco use: Secondary | ICD-10-CM | POA: Insufficient documentation

## 2014-09-05 DIAGNOSIS — N898 Other specified noninflammatory disorders of vagina: Secondary | ICD-10-CM | POA: Diagnosis present

## 2014-09-05 DIAGNOSIS — Z21 Asymptomatic human immunodeficiency virus [HIV] infection status: Secondary | ICD-10-CM | POA: Diagnosis not present

## 2014-09-06 ENCOUNTER — Encounter (HOSPITAL_COMMUNITY): Payer: Self-pay | Admitting: Emergency Medicine

## 2014-09-06 ENCOUNTER — Emergency Department (HOSPITAL_COMMUNITY)
Admission: EM | Admit: 2014-09-06 | Discharge: 2014-09-06 | Discharge status: Against Medical Advice | Payer: BC Managed Care – PPO | Attending: Emergency Medicine | Admitting: Emergency Medicine

## 2014-09-06 DIAGNOSIS — N898 Other specified noninflammatory disorders of vagina: Secondary | ICD-10-CM | POA: Diagnosis not present

## 2014-09-06 LAB — URINALYSIS, ROUTINE W REFLEX MICROSCOPIC
Bilirubin Urine: NEGATIVE
Glucose, UA: NEGATIVE mg/dL
Hgb urine dipstick: NEGATIVE
Ketones, ur: NEGATIVE mg/dL
Leukocytes, UA: NEGATIVE
Nitrite: NEGATIVE
Protein, ur: NEGATIVE mg/dL
Specific Gravity, Urine: 1.019 (ref 1.005–1.030)
Urobilinogen, UA: 1 mg/dL (ref 0.0–1.0)
pH: 6 (ref 5.0–8.0)

## 2014-09-06 LAB — PREGNANCY, URINE: Preg Test, Ur: NEGATIVE

## 2014-09-06 NOTE — ED Notes (Signed)
Called for pt in waiting room x 2; no answer

## 2014-09-06 NOTE — ED Notes (Signed)
Pt presents for evaluation of foul smelling vaginal discharge for the past week- also admits to pain with urination and rash to pelvic area.  Pt reports she recently was told one of her sexual partners was dx with HIV.  Denies abdominal pain.

## 2014-09-06 NOTE — ED Provider Notes (Signed)
Patient eloped from the emergency department before provider evaluation. I did not see or evaluate this patient. Triage note states patient presents for evaluation of foul-smelling vaginal discharge, painful urination rashes pelvic area. She also reports a history of HIV and one of her sexual partners. She denied abdominal pain.   Dahlia ClientHannah Tiaria Biby, PA-C 09/06/14 [phone]

## 2015-01-17 ENCOUNTER — Emergency Department (HOSPITAL_COMMUNITY)
Admission: EM | Admit: 2015-01-17 | Discharge: 2015-01-17 | Disposition: A | Discharge status: Home / Self Care | Payer: BC Managed Care – PPO | Attending: Emergency Medicine | Admitting: Emergency Medicine

## 2015-01-17 ENCOUNTER — Encounter (HOSPITAL_COMMUNITY): Payer: Self-pay | Admitting: *Deleted

## 2015-01-17 DIAGNOSIS — Z3202 Encounter for pregnancy test, result negative: Secondary | ICD-10-CM | POA: Insufficient documentation

## 2015-01-17 DIAGNOSIS — N3001 Acute cystitis with hematuria: Secondary | ICD-10-CM | POA: Diagnosis not present

## 2015-01-17 DIAGNOSIS — N938 Other specified abnormal uterine and vaginal bleeding: Secondary | ICD-10-CM | POA: Diagnosis present

## 2015-01-17 DIAGNOSIS — Z720 Tobacco use: Secondary | ICD-10-CM | POA: Insufficient documentation

## 2015-01-17 DIAGNOSIS — Z880 Allergy status to penicillin: Secondary | ICD-10-CM | POA: Insufficient documentation

## 2015-01-17 LAB — URINALYSIS, ROUTINE W REFLEX MICROSCOPIC
Bilirubin Urine: NEGATIVE
GLUCOSE, UA: NEGATIVE mg/dL
Ketones, ur: NEGATIVE mg/dL
Nitrite: NEGATIVE
Protein, ur: 100 mg/dL — AB
Specific Gravity, Urine: 1.023 (ref 1.005–1.030)
UROBILINOGEN UA: 1 mg/dL (ref 0.0–1.0)
pH: 7 (ref 5.0–8.0)

## 2015-01-17 LAB — POC URINE PREG, ED: Preg Test, Ur: NEGATIVE

## 2015-01-17 LAB — WET PREP, GENITAL
TRICH WET PREP: NONE SEEN
Yeast Wet Prep HPF POC: NONE SEEN

## 2015-01-17 LAB — URINE MICROSCOPIC-ADD ON

## 2015-01-17 MED ORDER — IBUPROFEN 800 MG PO TABS
800.0000 mg | ORAL_TABLET | Freq: Once | ORAL | Status: AC
  Administered 2015-01-17: 800 mg via ORAL
  Filled 2015-01-17: qty 1

## 2015-01-17 MED ORDER — LIDOCAINE HCL (PF) 1 % IJ SOLN
INTRAMUSCULAR | Status: AC
  Filled 2015-01-17: qty 5

## 2015-01-17 MED ORDER — CEPHALEXIN 500 MG PO CAPS: 500.0000 mg | ORAL_CAPSULE | Freq: Four times a day (QID) | ORAL | Status: DC

## 2015-01-17 MED ORDER — PHENAZOPYRIDINE HCL 200 MG PO TABS: 200.0000 mg | ORAL_TABLET | Freq: Three times a day (TID) | ORAL | Status: DC

## 2015-01-17 MED ORDER — CEFTRIAXONE SODIUM 1 G IJ SOLR
1.0000 g | Freq: Once | INTRAMUSCULAR | Status: AC
  Administered 2015-01-17: 1 g via INTRAMUSCULAR
  Filled 2015-01-17: qty 10

## 2015-01-17 MED ORDER — LIDOCAINE HCL (PF) 1 % IJ SOLN
2.0000 mL | Freq: Once | INTRAMUSCULAR | Status: AC
  Administered 2015-01-17: 2 mL via SUBCUTANEOUS

## 2015-01-17 NOTE — ED Provider Notes (Signed)
CSN: [id number]     Arrival date & time 01/17/15  1308 History   First MD Initiated Contact with Patient 01/17/15 1012     Chief Complaint  Patient presents with  . Abdominal Pain  . Vaginal Bleeding     (Consider location/radiation/quality/duration/timing/severity/associated sxs/prior Treatment) HPI   This is a 19 year old female who presents the emergency department with chief complaint of abdominal cramping and vaginal bleeding. She complains of pain which began 3 days ago. She states it was like her normal period cramps. Patient states that yesterday she began having heavy vaginal bleeding, which is abnormal for her. She states that her cramps have were extremely severe. She thought that she just had her regular period. Today she states that she began passing pink colored tissue from her vagina and she also has pain at the end of urination. She states "I am scared and I don't know what is going on and it hurts very badly." The patient has been in a monogamous relationship with her boyfriend for 5 years. She is not on birth control and does not use any condoms with intercourse. She states she her boyfriend get tested regularly for any STDs and they were  tested just last month. The patient denies any flank pain, vaginal symptoms, discharge, foul odor. She denies diarrhea, constipation.  Past Medical History  Diagnosis Date  . Medical history non-contributory   . Pre-eclampsia    Past Surgical History  Procedure Laterality Date  . Hernia repair    . Cesarean section N/A 06/21/2013    Procedure: CESAREAN SECTION;  Surgeon: Purcell Nails, MD;  Location: [facility];  Service: Obstetrics;  Laterality: N/A;   Family History  Problem Relation Age of Onset  . Diabetes Mother   . Hyperlipidemia Mother   . Asthma Mother   . Hyperlipidemia Father   . Hypertension Father   . Cancer Maternal Aunt     Rectal  . Cancer Maternal Grandmother   . Kidney disease Maternal Grandmother    History   Substance Use Topics  . Smoking status: Current Every Day Smoker -- 0.25 packs/day    Types: Cigarettes  . Smokeless tobacco: Never Used  . Alcohol Use: No     Comment: Occasional   OB History    Gravida Para Term Preterm AB TAB SAB Ectopic Multiple Living   Review of Systems  Ten systems reviewed and are negative for acute change, except as noted in the HPI.    Allergies  Penicillins  Home Medications   Prior to Admission medications   Medication Sig Start Date End Date Taking? Authorizing Provider  metroNIDAZOLE (FLAGYL) 500 MG tablet Take 1 tablet (500 mg total) by mouth 2 (two) times daily. One po bid x 7 days Patient not taking: Reported on 01/17/2015 06/25/14   Rodolph Bong, MD   BP 113/71 mmHg  Pulse 99  Temp(Src) 97.9 F (36.6 C) (Oral)  Resp 16  Ht  (1.651 m)  Wt 200 lb (90.719 kg)  BMI 33.28 kg/m2  SpO2 99%  LMP 12/21/2014 Physical Exam  Constitutional: She is oriented to person, place, and time. She appears well-developed and well-nourished. No distress.  HENT:  Head: Normocephalic and atraumatic.  Eyes: Conjunctivae are normal. No scleral icterus.  Neck: Normal range of motion.  Cardiovascular: Normal rate, regular rhythm and normal heart sounds.  Exam reveals no gallop and no friction rub.  No murmur heard. Pulmonary/Chest: Effort normal and breath sounds normal. No respiratory distress.  Abdominal: Soft. Bowel sounds are normal. She exhibits no distension and no mass. There is no tenderness. There is no guarding.  Genitourinary:  Pelvic exam: normal external genitalia, vulva, vagina, cervix, uterus and adnexa.   Neurological: She is alert and oriented to person, place, and time.  Skin: Skin is warm and dry. She is not diaphoretic.  Nursing note and vitals reviewed.   ED Course  Procedures (including critical care time) Labs Review Labs Reviewed  WET PREP, GENITAL - Abnormal; Notable for the following:    Clue Cells Wet  Prep HPF POC FEW (*)    WBC, Wet Prep HPF POC FEW (*)    All other components within normal limits  URINALYSIS, ROUTINE W REFLEX MICROSCOPIC (NOT AT [facility]) - Abnormal; Notable for the following:    APPearance TURBID (*)    Hgb urine dipstick LARGE (*)    Protein, ur 100 (*)    Leukocytes, UA LARGE (*)    All other components within normal limits  URINE MICROSCOPIC-ADD ON - Abnormal; Notable for the following:    Squamous Epithelial / LPF FEW (*)    All other components within normal limits  URINE CULTURE  POC URINE PREG, ED  GC/CHLAMYDIA PROBE AMP ([place]) NOT AT [facility]    Imaging Review No results found.   EKG Interpretation None      MDM   Final diagnoses:  Acute cystitis with hematuria   Normal vaginal exam Pt has been diagnosed with a UTI. Pt is afebrile, no CVA tenderness, normotensive, and denies N/V. Pt to be dc home with antibiotics and instructions to follow up with PCP if symptoms persist.     Arthor Captain, PA-C 01/17/15 1341  Gwyneth Sprout, MD 01/18/15 2218

## 2015-01-17 NOTE — Discharge Instructions (Signed)
You have been seen today for your complaint of pain with urination. °Your lab work showed urine infection. °Your discharge medications include: °1) Keflex °Please take all of your antibiotics until finished!   You may develop abdominal discomfort or diarrhea from the antibiotic.  You may help offset this with probiotics which you can buy or get in yogurt. Do not eat  or take the probiotics until 2 hours after your antibiotic.  °2) Pyridium °This medication will help relieve pain and burning but does not treat the infection.  Make sure that you wear a panty liner as it may stain your underwear. °Home care instructions are as follows:  °1) please drink plenty of water, avoid tea and beverages with caffeine like coffee or soda °2) if you are sexually active, ,make sure to urinate immediately after intercourse °Follow up with: your doctor or the emergency department °Please seek immediate medical care if you develop any of the following symptoms: °SEEK MEDICAL CARE IF:  °You have back pain.  °You develop a fever.  °Your symptoms do not begin to resolve within 3 days.  °SEEK IMMEDIATE MEDICAL CARE IF:  °You have severe back pain or lower abdominal pain.  °You develop chills.  °You have nausea or vomiting.  °You have continued burning or discomfort with urination. ° °

## 2015-01-17 NOTE — ED Notes (Signed)
Pt states lower cramping for several days, vaginal bleeding yesterday and burning with urination today.  LMP 5/13.

## 2015-01-18 LAB — GC/CHLAMYDIA PROBE AMP (~~LOC~~) NOT AT ARMC
Chlamydia: NEGATIVE
NEISSERIA GONORRHEA: NEGATIVE

## 2015-01-21 LAB — URINE CULTURE: Colony Count: >=100000

## 2015-01-22 ENCOUNTER — Telehealth: Payer: Self-pay | Admitting: Emergency Medicine

## 2015-01-22 NOTE — Telephone Encounter (Signed)
Post ED Visit - Positive Culture Follow-up  Culture report reviewed by antimicrobial stewardship pharmacist: []  Wes Dulaney, Pharm.D., BCPS [x]  Celedonio Miyamoto, Pharm.D., BCPS []  Georgina Pillion, Pharm.D., BCPS []  [place], [place].D., BCPS, AAHIVP []  Estella Husk, Pharm.D., BCPS, AAHIVP []  Elder Cyphers, [address].D., BCPS  Positive Urineculture Treated with Cephalexin, organism sensitive to the same and no further patient follow-up is required at this time.  Jiles Harold 01/22/2015, 3:07 PM

## 2015-01-27 ENCOUNTER — Inpatient Hospital Stay (HOSPITAL_COMMUNITY)
Admission: AD | Admit: 2015-01-27 | Discharge: 2015-01-28 | Disposition: A | Discharge status: Home / Self Care | Payer: BC Managed Care – PPO | Source: Ambulatory Visit | Attending: Obstetrics & Gynecology | Admitting: Obstetrics & Gynecology

## 2015-01-27 DIAGNOSIS — N760 Acute vaginitis: Secondary | ICD-10-CM | POA: Insufficient documentation

## 2015-01-27 DIAGNOSIS — F17210 Nicotine dependence, cigarettes, uncomplicated: Secondary | ICD-10-CM | POA: Insufficient documentation

## 2015-01-27 DIAGNOSIS — B9689 Other specified bacterial agents as the cause of diseases classified elsewhere: Secondary | ICD-10-CM

## 2015-01-27 HISTORY — DX: Depression, unspecified: F32.A

## 2015-01-27 HISTORY — DX: Major depressive disorder, single episode, unspecified: F32.9

## 2015-01-28 ENCOUNTER — Encounter (HOSPITAL_COMMUNITY): Payer: Self-pay | Admitting: *Deleted

## 2015-01-28 DIAGNOSIS — N760 Acute vaginitis: Secondary | ICD-10-CM | POA: Diagnosis not present

## 2015-01-28 DIAGNOSIS — A499 Bacterial infection, unspecified: Secondary | ICD-10-CM | POA: Diagnosis not present

## 2015-01-28 DIAGNOSIS — F17210 Nicotine dependence, cigarettes, uncomplicated: Secondary | ICD-10-CM | POA: Diagnosis not present

## 2015-01-28 DIAGNOSIS — N898 Other specified noninflammatory disorders of vagina: Secondary | ICD-10-CM | POA: Diagnosis present

## 2015-01-28 LAB — URINALYSIS, ROUTINE W REFLEX MICROSCOPIC
BILIRUBIN URINE: NEGATIVE
GLUCOSE, UA: NEGATIVE mg/dL
HGB URINE DIPSTICK: NEGATIVE
Ketones, ur: NEGATIVE mg/dL
LEUKOCYTES UA: NEGATIVE
NITRITE: NEGATIVE
PROTEIN: NEGATIVE mg/dL
Specific Gravity, Urine: 1.025 (ref 1.005–1.030)
UROBILINOGEN UA: 0.2 mg/dL (ref 0.0–1.0)
pH: 7 (ref 5.0–8.0)

## 2015-01-28 LAB — WET PREP, GENITAL
Clue Cells Wet Prep HPF POC: NONE SEEN
TRICH WET PREP: NONE SEEN
Yeast Wet Prep HPF POC: NONE SEEN

## 2015-01-28 LAB — POCT PREGNANCY, URINE: Preg Test, Ur: NEGATIVE

## 2015-01-28 MED ORDER — FLUCONAZOLE 150 MG PO TABS: 150.0000 mg | ORAL_TABLET | Freq: Every day | ORAL | Status: DC

## 2015-01-28 MED ORDER — METRONIDAZOLE 500 MG PO TABS: 500.0000 mg | ORAL_TABLET | Freq: Two times a day (BID) | ORAL | Status: DC

## 2015-01-28 NOTE — MAU Note (Signed)
LMP 5/17; did not take pregnancy test at home but wants one here. Last seen at [facility] 1 year ago.  Wants to be checked for STDs. Vaginal discharge with foul odor x 1 week; yellow & thick. Vaginal irritation.

## 2015-01-28 NOTE — MAU Provider Note (Signed)
History     CSN: [id number]  Arrival date and time: 01/27/15 2358   First Provider Initiated Contact with Patient 01/28/15 [phone]      Chief Complaint  Patient presents with  . Possible Pregnancy  . Exposure to STD   HPI [name] is a 19 y.o. G1P1001 who presents to MAU today with complaint of vaginal discharge and odor. The patient states a thick, yellow-green malodorous discharge noted x 2 weeks. She denies vaginal itching, irritation, pelvic pain, fever or UTI symptoms. She was seen at [facility] on 01/17/15 with the same complaint. GC/Chlamdyia was negative. Wet prep showed few clues. Patient was not treated for BV but was given Keflex for possible UTI. She states that she took some of the medication, but not as directed.   OB History    Gravida Para Term Preterm AB TAB SAB Ectopic Multiple Living   1 1 1       1       Past Medical History  Diagnosis Date  . Medical history non-contributory   . Pre-eclampsia   . Depression     post partum depression for two weeks    Past Surgical History  Procedure Laterality Date  . Hernia repair    . Cesarean section N/A 06/21/2013    Procedure: CESAREAN SECTION;  Surgeon: Purcell Nails, MD;  Location: [facility];  Service: Obstetrics;  Laterality: N/A;    Family History  Problem Relation Age of Onset  . Diabetes Mother   . Hyperlipidemia Mother   . Asthma Mother   . Hyperlipidemia Father   . Hypertension Father   . Cancer Maternal Aunt     Rectal  . Cancer Maternal Grandmother   . Kidney disease Maternal Grandmother     History  Substance Use Topics  . Smoking status: Current Every Day Smoker -- 0.25 packs/day    Types: Cigarettes  . Smokeless tobacco: Never Used  . Alcohol Use: No     Comment: Occasional    Allergies:  Allergies  Allergen Reactions  . Penicillins Hives    Prescriptions prior to admission  Medication Sig Dispense Refill Last Dose  . cephALEXin (KEFLEX) 500 MG capsule Take 1 capsule (500  mg total) by mouth 4 (four) times daily. 20 capsule 0 01/24/2015 at Unknown time  . phenazopyridine (PYRIDIUM) 200 MG tablet Take 1 tablet (200 mg total) by mouth 3 (three) times daily. 6 tablet 0 Past Week at Unknown time    Review of Systems  Constitutional: Negative for fever and malaise/fatigue.  Gastrointestinal: Negative for abdominal pain.  Genitourinary: Negative for dysuria, urgency and frequency.       + vaginal discharge Neg - vaginal bleeding   Physical Exam   Blood pressure 125/73, pulse 83, temperature 98.7 F (37.1 C), resp. rate 16, height 5\' 5"  (1.651 m), weight 197 lb 9.6 oz (89.631 kg), last menstrual period 12/25/2014, SpO2 100 %, not currently breastfeeding.  Physical Exam  Nursing note and vitals reviewed. Constitutional: She is oriented to person, place, and time. She appears well-developed and well-nourished. No distress.  HENT:  Head: Normocephalic and atraumatic.  Cardiovascular: Normal rate.   Respiratory: Effort normal.  GI: Soft. She exhibits no distension and no mass. There is no tenderness. There is no rebound and no guarding.  Genitourinary: Uterus is not enlarged and not tender. Cervix exhibits no motion tenderness, no discharge and no friability. Right adnexum displays no mass and no tenderness. Left adnexum displays no mass and  no tenderness. No bleeding in the vagina. Vaginal discharge (small amount of thin, white, malodorous discharge) found.  Neurological: She is alert and oriented to person, place, and time.  Skin: Skin is warm and dry. No erythema.  Psychiatric: She has a normal mood and affect.   Results for orders placed or performed during the hospital encounter of 01/27/15 (from the past 24 hour(s))  Urinalysis, Routine w reflex microscopic (not at [facility])     Status: None   Collection Time: 01/28/15 12:09 AM  Result Value Ref Range   Color, Urine YELLOW YELLOW   APPearance CLEAR CLEAR   Specific Gravity, Urine 1.025 1.005 - 1.030   pH 7.0  5.0 - 8.0   Glucose, UA NEGATIVE NEGATIVE mg/dL   Hgb urine dipstick NEGATIVE NEGATIVE   Bilirubin Urine NEGATIVE NEGATIVE   Ketones, ur NEGATIVE NEGATIVE mg/dL   Protein, ur NEGATIVE NEGATIVE mg/dL   Urobilinogen, UA 0.2 0.0 - 1.0 mg/dL   Nitrite NEGATIVE NEGATIVE   Leukocytes, UA NEGATIVE NEGATIVE  Pregnancy, urine POC     Status: None   Collection Time: 01/28/15 12:24 AM  Result Value Ref Range   Preg Test, Ur NEGATIVE NEGATIVE  Wet prep, genital     Status: Abnormal   Collection Time: 01/28/15  1:00 AM  Result Value Ref Range   Yeast Wet Prep HPF POC NONE SEEN NONE SEEN   Trich, Wet Prep NONE SEEN NONE SEEN   Clue Cells Wet Prep HPF POC NONE SEEN NONE SEEN   WBC, Wet Prep HPF POC FEW (A) NONE SEEN    MAU Course  Procedures None  MDM UPT - negative UA, wet prep Patient requests Diflucan Rx if antibiotics are required.  Assessment and Plan  A: Bacterial vaginosis, clinical  P: Discharge home Rx for Flagyl and Diflucan given to patient Patient advised to follow-up with OB/Gyn of choice for routine health maintenance Patient may return to MAU as needed or if her condition were to change or worsen   Marny Lowenstein, PA-C  01/28/2015, 1:31 AM

## 2015-01-28 NOTE — Discharge Instructions (Signed)
Bacterial Vaginosis Bacterial vaginosis is a vaginal infection that occurs when the normal balance of bacteria in the vagina is disrupted. It results from an overgrowth of certain bacteria. This is the most common vaginal infection in women of childbearing age. Treatment is important to prevent complications, especially in pregnant women, as it can cause a premature delivery. CAUSES  Bacterial vaginosis is caused by an increase in harmful bacteria that are normally present in smaller amounts in the vagina. Several different kinds of bacteria can cause bacterial vaginosis. However, the reason that the condition develops is not fully understood. RISK FACTORS Certain activities or behaviors can put you at an increased risk of developing bacterial vaginosis, including:  Having a new sex partner or multiple sex partners.  Douching.  Using an intrauterine device (IUD) for contraception. Women do not get bacterial vaginosis from toilet seats, bedding, swimming pools, or contact with objects around them. SIGNS AND SYMPTOMS  Some women with bacterial vaginosis have no signs or symptoms. Common symptoms include:  Grey vaginal discharge.  A fishlike odor with discharge, especially after sexual intercourse.  Itching or burning of the vagina and vulva.  Burning or pain with urination. DIAGNOSIS  Your health care provider will take a medical history and examine the vagina for signs of bacterial vaginosis. A sample of vaginal fluid may be taken. Your health care provider will look at this sample under a microscope to check for bacteria and abnormal cells. A vaginal pH test may also be done.  TREATMENT  Bacterial vaginosis may be treated with antibiotic medicines. These may be given in the form of a pill or a vaginal cream. A second round of antibiotics may be prescribed if the condition comes back after treatment.  HOME CARE INSTRUCTIONS   Only take over-the-counter or prescription medicines as  directed by your health care provider.  If antibiotic medicine was prescribed, take it as directed. Make sure you finish it even if you start to feel better.  Do not have sex until treatment is completed.  Tell all sexual partners that you have a vaginal infection. They should see their health care provider and be treated if they have problems, such as a mild rash or itching.  Practice safe sex by using condoms and only having one sex partner. SEEK MEDICAL CARE IF:   Your symptoms are not improving after 3 days of treatment.  You have increased discharge or pain.  You have a fever. MAKE SURE YOU:   Understand these instructions.  Will watch your condition.  Will get help right away if you are not doing well or get worse. FOR MORE INFORMATION  Centers for Disease Control and Prevention, Division of STD Prevention: www.cdc.gov/std American Sexual Health Association (ASHA): www.ashastd.org  Document Released: 07/27/2005 Document Revised: 05/17/2013 Document Reviewed: 03/08/2013 Exit[employer] Patient Information 2015 [employer]. This information is not intended to replace advice given to you by your health care provider. Make sure you discuss any questions you have with your health care provider.  

## 2015-01-28 NOTE — MAU Note (Signed)
Pt reports that she last took her antibiotic four days ago because she started to feel better but she still has pills in the container.  Discussed the importance of finishing all medication as prescribed.

## 2015-02-02 ENCOUNTER — Encounter (HOSPITAL_COMMUNITY): Payer: Self-pay | Admitting: Emergency Medicine

## 2015-02-02 ENCOUNTER — Emergency Department (INDEPENDENT_AMBULATORY_CARE_PROVIDER_SITE_OTHER)
Admission: EM | Admit: 2015-02-02 | Discharge: 2015-02-02 | Disposition: A | Discharge status: Home / Self Care | Payer: BC Managed Care – PPO | Source: Home / Self Care | Attending: Family Medicine | Admitting: Family Medicine

## 2015-02-02 DIAGNOSIS — N3001 Acute cystitis with hematuria: Secondary | ICD-10-CM

## 2015-02-02 LAB — POCT URINALYSIS DIP (DEVICE)
BILIRUBIN URINE: NEGATIVE
Glucose, UA: NEGATIVE mg/dL
Hgb urine dipstick: NEGATIVE
Ketones, ur: NEGATIVE mg/dL
Nitrite: NEGATIVE
PROTEIN: 100 mg/dL — AB
Specific Gravity, Urine: 1.025 (ref 1.005–1.030)
UROBILINOGEN UA: 0.2 mg/dL (ref 0.0–1.0)
pH: 6.5 (ref 5.0–8.0)

## 2015-02-02 LAB — POCT PREGNANCY, URINE: Preg Test, Ur: NEGATIVE

## 2015-02-02 MED ORDER — CIPROFLOXACIN HCL 500 MG PO TABS: 500.0000 mg | ORAL_TABLET | Freq: Two times a day (BID) | ORAL | Status: DC

## 2015-02-02 NOTE — Discharge Instructions (Signed)
Thank you for coming in today. Used birth control taking this medication. Take it for one week. Follow-up with your primary care doctor as needed.    Urinary Tract Infection Urinary tract infections (UTIs) can develop anywhere along your urinary tract. Your urinary tract is your body's drainage system for removing wastes and extra water. Your urinary tract includes two kidneys, two ureters, a bladder, and a urethra. Your kidneys are a pair of bean-shaped organs. Each kidney is about the size of your fist. They are located below your ribs, one on each side of your spine. CAUSES Infections are caused by microbes, which are microscopic organisms, including fungi, viruses, and bacteria. These organisms are so small that they can only be seen through a microscope. Bacteria are the microbes that most commonly cause UTIs. SYMPTOMS  Symptoms of UTIs may vary by age and gender of the patient and by the location of the infection. Symptoms in young women typically include a frequent and intense urge to urinate and a painful, burning feeling in the bladder or urethra during urination. Older women and men are more likely to be tired, shaky, and weak and have muscle aches and abdominal pain. A fever may mean the infection is in your kidneys. Other symptoms of a kidney infection include pain in your back or sides below the ribs, nausea, and vomiting. DIAGNOSIS To diagnose a UTI, your caregiver will ask you about your symptoms. Your caregiver also will ask to provide a urine sample. The urine sample will be tested for bacteria and white blood cells. White blood cells are made by your body to help fight infection. TREATMENT  Typically, UTIs can be treated with medication. Because most UTIs are caused by a bacterial infection, they usually can be treated with the use of antibiotics. The choice of antibiotic and length of treatment depend on your symptoms and the type of bacteria causing your infection. HOME CARE  INSTRUCTIONS  If you were prescribed antibiotics, take them exactly as your caregiver instructs you. Finish the medication even if you feel better after you have only taken some of the medication.  Drink enough water and fluids to keep your urine clear or pale yellow.  Avoid caffeine, tea, and carbonated beverages. They tend to irritate your bladder.  Empty your bladder often. Avoid holding urine for long periods of time.  Empty your bladder before and after sexual intercourse.  After a bowel movement, women should cleanse from front to back. Use each tissue only once. SEEK MEDICAL CARE IF:   You have back pain.  You develop a fever.  Your symptoms do not begin to resolve within 3 days. SEEK IMMEDIATE MEDICAL CARE IF:   You have severe back pain or lower abdominal pain.  You develop chills.  You have nausea or vomiting.  You have continued burning or discomfort with urination. MAKE SURE YOU:   Understand these instructions.  Will watch your condition.  Will get help right away if you are not doing well or get worse. Document Released: 05/06/2005 Document Revised: 01/26/2012 Document Reviewed: 09/04/2011 [facility] Patient Information 2015 [place], [place]. This information is not intended to replace advice given to you by your health care provider. Make sure you discuss any questions you have with your health care provider.

## 2015-02-02 NOTE — ED Provider Notes (Signed)
Natalie Chavez is a 19 y.o. female who presents to Urgent Care today for UTI. Patient developed urinary frequency urgency dysuria and hematuria the day or 2 ago. Her symptoms are consistent with UTI. She was diagnosed with a UTI on June 9. Urine culture showed intermediate sensitivity to cefazolin. She was treated with Keflex. She completed the partial course but did not finish the antibiotics. No vomiting or diarrhea. No current vaginal discharge.   Past Medical History  Diagnosis Date  . Medical history non-contributory   . Pre-eclampsia   . Depression     post partum depression for two weeks   Past Surgical History  Procedure Laterality Date  . Hernia repair    . Cesarean section N/A 06/21/2013    Procedure: CESAREAN SECTION;  Surgeon: Purcell Nails, MD;  Location: [facility];  Service: Obstetrics;  Laterality: N/A;   History  Substance Use Topics  . Smoking status: Current Every Day Smoker -- 0.25 packs/day    Types: Cigarettes  . Smokeless tobacco: Never Used  . Alcohol Use: No     Comment: Occasional   ROS as above Medications: No current facility-administered medications for this encounter.   Current Outpatient Prescriptions  Medication Sig Dispense Refill  . ciprofloxacin (CIPRO) 500 MG tablet Take 1 tablet (500 mg total) by mouth 2 (two) times daily. 14 tablet 0  . fluconazole (DIFLUCAN) 150 MG tablet Take 1 tablet (150 mg total) by mouth daily. 1 tablet 0  . metroNIDAZOLE (FLAGYL) 500 MG tablet Take 1 tablet (500 mg total) by mouth 2 (two) times daily. 14 tablet 0   Allergies  Allergen Reactions  . Penicillins Hives     Exam:  BP 105/75 mmHg  Pulse 98  Temp(Src) 99.3 F (37.4 C) (Oral)  Resp 16  SpO2 98%  LMP 12/25/2014 (Exact Date) Gen: Well NAD HEENT: EOMI,  MMM Lungs: Normal work of breathing. CTABL Heart: RRR no MRG Abd: NABS, Soft. Nondistended, Nontender no CV angle tenderness to percussion Exts: Brisk capillary refill, warm and well perfused.    Results for orders placed or performed during the hospital encounter of 02/02/15 (from the past 24 hour(s))  POCT urinalysis dip (device)     Status: Abnormal   Collection Time: 02/02/15  2:44 PM  Result Value Ref Range   Glucose, UA NEGATIVE NEGATIVE mg/dL   Bilirubin Urine NEGATIVE NEGATIVE   Ketones, ur NEGATIVE NEGATIVE mg/dL   Specific Gravity, Urine 1.025 1.005 - 1.030   Hgb urine dipstick NEGATIVE NEGATIVE   pH 6.5 5.0 - 8.0   Protein, ur 100 (A) NEGATIVE mg/dL   Urobilinogen, UA 0.2 0.0 - 1.0 mg/dL   Nitrite NEGATIVE NEGATIVE   Leukocytes, UA LARGE (A) NEGATIVE  Pregnancy, urine POC     Status: None   Collection Time: 02/02/15  2:47 PM  Result Value Ref Range   Preg Test, Ur NEGATIVE NEGATIVE   No results found.  Assessment and Plan: 19 y.o. female with UTI. Likely as result of partially treated bacteria that are intermediate resistant to cefazolin. Treat with Cipro is the shows good sensitivity to the bacteria. Culture pending.  Discussed warning signs or symptoms. Please see discharge instructions. Patient expresses understanding.     Rodolph Bong, MD 02/02/15 [phone]

## 2015-02-02 NOTE — ED Notes (Signed)
C/o uti States she has lower abd pain States she has pain when urinating Does have a little blood in urine

## 2015-02-05 ENCOUNTER — Encounter (HOSPITAL_COMMUNITY): Payer: Self-pay | Admitting: *Deleted

## 2015-02-05 ENCOUNTER — Inpatient Hospital Stay (HOSPITAL_COMMUNITY)
Admission: AD | Admit: 2015-02-05 | Discharge: 2015-02-05 | Disposition: A | Discharge status: Home / Self Care | Payer: BC Managed Care – PPO | Source: Ambulatory Visit | Attending: Family Medicine | Admitting: Family Medicine

## 2015-02-05 DIAGNOSIS — F17210 Nicotine dependence, cigarettes, uncomplicated: Secondary | ICD-10-CM | POA: Insufficient documentation

## 2015-02-05 DIAGNOSIS — N921 Excessive and frequent menstruation with irregular cycle: Secondary | ICD-10-CM | POA: Diagnosis not present

## 2015-02-05 DIAGNOSIS — N939 Abnormal uterine and vaginal bleeding, unspecified: Secondary | ICD-10-CM | POA: Diagnosis present

## 2015-02-05 DIAGNOSIS — N946 Dysmenorrhea, unspecified: Secondary | ICD-10-CM | POA: Insufficient documentation

## 2015-02-05 DIAGNOSIS — N920 Excessive and frequent menstruation with regular cycle: Secondary | ICD-10-CM | POA: Diagnosis not present

## 2015-02-05 HISTORY — DX: Urinary tract infection, site not specified: N39.0

## 2015-02-05 LAB — CBC
HCT: 39.5 % (ref 36.0–46.0)
Hemoglobin: 13.3 g/dL (ref 12.0–15.0)
MCH: 29.8 pg (ref 26.0–34.0)
MCHC: 33.7 g/dL (ref 30.0–36.0)
MCV: 88.4 fL (ref 78.0–100.0)
Platelets: 243 10*3/uL (ref 150–400)
RBC: 4.47 MIL/uL (ref 3.87–5.11)
RDW: 13.1 % (ref 11.5–15.5)
WBC: 11.5 10*3/uL — AB (ref 4.0–10.5)

## 2015-02-05 LAB — POCT PREGNANCY, URINE: PREG TEST UR: NEGATIVE

## 2015-02-05 MED ORDER — PHENAZOPYRIDINE HCL 100 MG PO TABS
200.0000 mg | ORAL_TABLET | Freq: Once | ORAL | Status: AC
  Administered 2015-02-05: 200 mg via ORAL
  Filled 2015-02-05: qty 2

## 2015-02-05 MED ORDER — PHENAZOPYRIDINE HCL 200 MG PO TABS: 200.0000 mg | ORAL_TABLET | Freq: Three times a day (TID) | ORAL | Status: DC | PRN

## 2015-02-05 MED ORDER — KETOROLAC TROMETHAMINE 10 MG PO TABS: 10.0000 mg | ORAL_TABLET | Freq: Four times a day (QID) | ORAL | Status: DC | PRN

## 2015-02-05 MED ORDER — MEDROXYPROGESTERONE ACETATE 10 MG PO TABS: 10.0000 mg | ORAL_TABLET | Freq: Every day | ORAL | Status: DC

## 2015-02-05 MED ORDER — IBUPROFEN 600 MG PO TABS
600.0000 mg | ORAL_TABLET | Freq: Once | ORAL | Status: AC
  Administered 2015-02-05: 600 mg via ORAL
  Filled 2015-02-05: qty 1

## 2015-02-05 NOTE — MAU Provider Note (Signed)
Chief Complaint: Vaginal Bleeding   First Provider Initiated Contact with Patient 02/05/15 1403     SUBJECTIVE HPI: Natalie Chavez is a [age] y.o. G1P1001 who presents to [facility] Admissions reporting Vaginal bleeding heavier than a normal menstrual period and cramping since today. Dx/Tx'd for 2 UTI's and BV in the past month. Started Cipro two days ago. Sx of UTI improving, but still having dysuria. Last period two weeks ago was lighter and shorter than usual, but normal time. No Hx irreg cycels or heavy bleeding.   Pt is sexually active, no birth control. She states she is in a mutually monogamous relationship. GC/Chlamydia cultures negative 01/17/2015. Declines repeat testing.   Past Medical History  Diagnosis Date  . Medical history non-contributory   . Pre-eclampsia   . Depression     post partum depression for two weeks  . Urinary tract infection    OB History  Gravida Para Term Preterm AB SAB TAB Ectopic Multiple Living  1 1 1       1     # Outcome Date GA Lbr Len/2nd Weight Sex Delivery Anes PTL Lv  1 Term 06/21/13 [id number]  7 lb 9.7 oz (3.45 kg) M CS-LTranv EPI  Y     Past Surgical History  Procedure Laterality Date  . Hernia repair    . Cesarean section N/A 06/21/2013    Procedure: CESAREAN SECTION;  Surgeon: Purcell NailsAngela Y Roberts, MD;  Location: [facility];  Service: Obstetrics;  Laterality: N/A;   History   Social History  . Marital Status: Single    Spouse Name: N/A  . Number of Children: N/A  . Years of Education: N/A   Occupational History  . Not on file.   Social History Main Topics  . Smoking status: Current Every Day Smoker -- 0.25 packs/day    Types: Cigarettes  . Smokeless tobacco: Never Used  . Alcohol Use: No     Comment: Occasional  . Drug Use: No  . Sexual Activity: Yes    Birth Control/ Protection: None     Comment: last sex, 16 Jun 16    Other Topics Concern  . Not on file   Social History Narrative   No current facility-administered  medications on file prior to encounter.   Current Outpatient Prescriptions on File Prior to Encounter  Medication Sig Dispense Refill  . ciprofloxacin (CIPRO) 500 MG tablet Take 1 tablet (500 mg total) by mouth 2 (two) times daily. (Patient taking differently: Take 500 mg by mouth 2 (two) times daily. Pt started on 02/04/15 to take for 7 days.) 14 tablet 0  . metroNIDAZOLE (FLAGYL) 500 MG tablet Take 1 tablet (500 mg total) by mouth 2 (two) times daily. (Patient taking differently: Take 500 mg by mouth 2 (two) times daily. Pt started on 01/29/15 to take for 7 days.) 14 tablet 0  . fluconazole (DIFLUCAN) 150 MG tablet Take 1 tablet (150 mg total) by mouth daily. (Patient not taking: Reported on 02/05/2015) 1 tablet 0   Allergies  Allergen Reactions  . Penicillins Hives    Review of Systems  Constitutional: Negative for fever and chills.  Gastrointestinal: Positive for abdominal pain. Negative for nausea, vomiting, diarrhea and constipation.  Genitourinary: Negative for dysuria, urgency, frequency, hematuria and flank pain.       Positive for vaginal bleeding. Negative for vaginal discharge dyspareunia.  Neurological: Negative for dizziness and weakness.  Endo/Heme/Allergies: Does not bruise/bleed easily.    OBJECTIVE Blood pressure 123/68, pulse 95, temperature 98.5  F (36.9 C), temperature source Oral, resp. rate 16, height  (1.651 m), weight 196 lb (88.905 kg), last menstrual period 02/05/2015, SpO2 100 %, not currently breastfeeding. GENERAL: Well-developed, well-nourished female in no acute distress.  HEART: normal rate RESP: normal effort GI: Abdomen soft, non-tender. Positive bowel sounds 4. MS: Nontender, no edema NEURO: Alert and oriented GU EXAM: NEFG, small amount of bright red blood noted, cervix clean, cervix closed; uterus normal size, no adnexal tenderness or masses. Cervical motion tenderness. No CVA tenderness.  LAB RESULTS Results for orders placed or performed  during the hospital encounter of 02/05/15 (from the past 24 hour(s))  Pregnancy, urine POC     Status: None   Collection Time: 02/05/15  2:15 PM  Result Value Ref Range   Preg Test, Ur NEGATIVE NEGATIVE  CBC     Status: Abnormal   Collection Time: 02/05/15  2:28 PM  Result Value Ref Range   WBC 11.5 (H) 4.0 - 10.5 K/uL   RBC 4.47 3.87 - 5.11 MIL/uL   Hemoglobin 13.3 12.0 - 15.0 g/dL   HCT 10.2 72.5 - 36.6 %   MCV 88.4 78.0 - 100.0 fL   MCH 29.8 26.0 - 34.0 pg   MCHC 33.7 30.0 - 36.0 g/dL   RDW 44.0 34.7 - 42.5 %   Platelets 243 150 - 400 K/uL    IMAGING No results found.  MAU COURSE UPT, CBC, ibuprofen, Pyridium.  Pain improve significantly, but asking if she can have something stronger for home. Bleeding, hemoglobin stable.  ASSESSMENT 1. Menorrhagia with irregular cycle   2. Dysmenorrhea     PLAN Discharge home in stable condition. Comfort measures. Bleeding precautions. Rx Provera, Toradol and Pyridium.     Follow-up Information    Follow up with Gynecologist of your choice.   Why:  For menstrual problems and routine gynecology care      Follow up with [facility] [place] [facility]ERN[facility] ADMISS[facility].   Why:  As needed in emergencies   Contact information:   [address] [id number] mc [place] [address] [phone]       Medication List    TAKE these medications        ciprofloxacin 500 MG tablet  Commonly known as:  CIPRO  Take 1 tablet (500 mg total) by mouth 2 (two) times daily.     fluconazole 150 MG tablet  Commonly known as:  DIFLUCAN  Take 1 tablet (150 mg total) by mouth daily.     ketorolac 10 MG tablet  Commonly known as:  TORADOL  Take 1 tablet (10 mg total) by mouth every 6 (six) hours as needed.     medroxyPROGESTERone 10 MG tablet  Commonly known as:  PROVERA  Take 1 tablet (10 mg total) by mouth daily.     metroNIDAZOLE 500 MG tablet  Commonly known as:  FLAGYL  Take 1 tablet (500 mg total) by  mouth 2 (two) times daily.     phenazopyridine 200 MG tablet  Commonly known as:  PYRIDIUM  Take 1 tablet (200 mg total) by mouth 3 (three) times daily as needed for pain.       [place], CNM 02/05/2015  4:40 PM

## 2015-02-05 NOTE — Discharge Instructions (Signed)
Abnormal Uterine Bleeding Abnormal uterine bleeding can affect women at various stages in life, including teenagers, women in their reproductive years, pregnant women, and women who have reached menopause. Several kinds of uterine bleeding are considered abnormal, including:  Bleeding or spotting between periods.   Bleeding after sexual intercourse.   Bleeding that is heavier or more than normal.   Periods that last longer than usual.  Bleeding after menopause.  Many cases of abnormal uterine bleeding are minor and simple to treat, while others are more serious. Any type of abnormal bleeding should be evaluated by your health care provider. Treatment will depend on the cause of the bleeding. HOME CARE INSTRUCTIONS Monitor your condition for any changes. The following actions may help to alleviate any discomfort you are experiencing:  Avoid the use of tampons and douches as directed by your health care provider.  Change your pads frequently. You should get regular pelvic exams and Pap tests. Keep all follow-up appointments for diagnostic tests as directed by your health care provider.  SEEK MEDICAL CARE IF:   Your bleeding lasts more than 1 week.   You feel dizzy at times.  SEEK IMMEDIATE MEDICAL CARE IF:   You pass out.   You are changing pads every 15 to 30 minutes.   You have abdominal pain.  You have a fever.   You become sweaty or weak.   You are passing large blood clots from the vagina.   You start to feel nauseous and vomit. MAKE SURE YOU:   Understand these instructions.  Will watch your condition.  Will get help right away if you are not doing well or get worse. Document Released: 07/27/2005 Document Revised: 08/01/2013 Document Reviewed: 02/23/2013 [facility] Patient Information 2015 [place], [place]. This information is not intended to replace advice given to you by your health care provider. Make sure you discuss any questions you have with your  health care provider.  Dysmenorrhea Menstrual cramps (dysmenorrhea) are caused by the muscles of the uterus tightening (contracting) during a menstrual period. For some women, this discomfort is merely bothersome. For others, dysmenorrhea can be severe enough to interfere with everyday activities for a few days each month. Primary dysmenorrhea is menstrual cramps that last a couple of days when you start having menstrual periods or soon after. This often begins after a teenager starts having her period. As a woman gets older or has a baby, the cramps will usually lessen or disappear. Secondary dysmenorrhea begins later in life, lasts longer, and the pain may be stronger than primary dysmenorrhea. The pain may start before the period and last a few days after the period.  CAUSES  Dysmenorrhea is usually caused by an underlying problem, such as:  The tissue lining the uterus grows outside of the uterus in other areas of the body (endometriosis).  The endometrial tissue, which normally lines the uterus, is found in or grows into the muscular walls of the uterus (adenomyosis).  The pelvic blood vessels are engorged with blood just before the menstrual period (pelvic congestive syndrome).  Overgrowth of cells (polyps) in the lining of the uterus or cervix.  Falling down of the uterus (prolapse) because of loose or stretched ligaments.  Depression.  Bladder problems, infection, or inflammation.  Problems with the intestine, a tumor, or irritable bowel syndrome.  Cancer of the female organs or bladder.  A severely tipped uterus.  A very tight opening or closed cervix.  Noncancerous tumors of the uterus (fibroids).  Pelvic inflammatory disease (  PID).  Pelvic scarring (adhesions) from a previous surgery.  Ovarian cyst.  An intrauterine device (IUD) used for birth control. RISK FACTORS You may be at greater risk of dysmenorrhea if:  You are younger than age 20.  You started puberty  early.  You have irregular or heavy bleeding.  You have never given birth.  You have a family history of this problem.  You are a smoker. SIGNS AND SYMPTOMS   Cramping or throbbing pain in your lower abdomen.  Headaches.  Lower back pain.  Nausea or vomiting.  Diarrhea.  Sweating or dizziness.  Loose stools. DIAGNOSIS  A diagnosis is based on your history, symptoms, physical exam, diagnostic tests, or procedures. Diagnostic tests or procedures may include:  Blood tests.  Ultrasonography.  An examination of the lining of the uterus (dilation and curettage, D&C).  An examination inside your abdomen or pelvis with a scope (laparoscopy).  X-rays.  CT scan.  MRI.  An examination inside the bladder with a scope (cystoscopy).  An examination inside the intestine or stomach with a scope (colonoscopy, gastroscopy). TREATMENT  Treatment depends on the cause of the dysmenorrhea. Treatment may include:  Pain medicine prescribed by your health care provider.  Birth control pills or an IUD with progesterone hormone in it.  Hormone replacement therapy.  Nonsteroidal anti-inflammatory drugs (NSAIDs). These may help stop the production of prostaglandins.  Surgery to remove adhesions, endometriosis, ovarian cyst, or fibroids.  Removal of the uterus (hysterectomy).  Progesterone shots to stop the menstrual period.  Cutting the nerves on the sacrum that go to the female organs (presacral neurectomy).  Electric current to the sacral nerves (sacral nerve stimulation).  Antidepressant medicine.  Psychiatric therapy, counseling, or group therapy.  Exercise and physical therapy.  Meditation and yoga therapy.  Acupuncture. HOME CARE INSTRUCTIONS   Only take over-the-counter or prescription medicines as directed by your health care provider.  Place a heating pad or hot water bottle on your lower back or abdomen. Do not sleep with the heating pad.  Use aerobic  exercises, walking, swimming, biking, and other exercises to help lessen the cramping.  Massage to the lower back or abdomen may help.  Stop smoking.  Avoid alcohol and caffeine. SEEK MEDICAL CARE IF:   Your pain does not get better with medicine.  You have pain with sexual intercourse.  Your pain increases and is not controlled with medicines.  You have abnormal vaginal bleeding with your period.  You develop nausea or vomiting with your period that is not controlled with medicine. SEEK IMMEDIATE MEDICAL CARE IF:  You pass out.  Document Released: 07/27/2005 Document Revised: 03/29/2013 Document Reviewed: 01/12/2013 [facility] Patient Information 2015 [place]. This information is not intended to replace advice given to you by your health care provider. Make sure you discuss any questions you have with your health care provider.

## 2015-02-05 NOTE — MAU Note (Signed)
Long month. Had 2 UTI's and ? Bacterial infections.  Started on real strong antibiotics yesterday; started bleeding today.  Had not had period this month, had only been spotting.

## 2015-02-05 NOTE — MAU Note (Signed)
Urine in lab 

## 2015-02-06 ENCOUNTER — Encounter: Payer: Self-pay | Admitting: *Deleted

## 2015-02-06 LAB — HIV ANTIBODY (ROUTINE TESTING W REFLEX): HIV SCREEN 4TH GENERATION: NONREACTIVE

## 2015-02-06 LAB — URINE CULTURE: Special Requests: NORMAL

## 2015-02-07 ENCOUNTER — Telehealth (HOSPITAL_COMMUNITY): Payer: Self-pay

## 2015-02-07 ENCOUNTER — Telehealth (HOSPITAL_BASED_OUTPATIENT_CLINIC_OR_DEPARTMENT_OTHER): Payer: Self-pay | Admitting: Emergency Medicine

## 2015-02-07 NOTE — Telephone Encounter (Signed)
Post ED Visit - Positive Culture Follow-up  Culture report reviewed by antimicrobial stewardship pharmacist: []  Wes Dulaney, Pharm.D., BCPS []  Celedonio MiyamotoJeremy Frens, Pharm.D., BCPS []  Georgina PillionElizabeth Martin, [address] BCPS []  Amador PinesMinh Pham, [address]Ph[address]., BCPS, AAHIVP []  Estella HuskMichelle Turner, Pharm.D., BCPS, AAHIVP []  Elder CyphersLorie Poole, [address]Ph[address]D., BCPS Garvin FilaMike Maccia PharmD  Positive urine culture E. Coli and Proteus Mirabilis Treated with ciprofloxacin, organism sensitive to the same and no further patient follow-up is required at this time.  Berle MullMiller, Lanyiah Brix 02/07/2015, 12:01 PM

## 2015-09-22 ENCOUNTER — Inpatient Hospital Stay (HOSPITAL_COMMUNITY)
Admission: AD | Admit: 2015-09-22 | Discharge: 2015-09-22 | Disposition: A | Discharge status: Home / Self Care | Payer: Medicaid Other | Source: Ambulatory Visit | Attending: Obstetrics and Gynecology | Admitting: Obstetrics and Gynecology

## 2015-09-22 ENCOUNTER — Encounter (HOSPITAL_COMMUNITY): Payer: Self-pay | Admitting: *Deleted

## 2015-09-22 DIAGNOSIS — N760 Acute vaginitis: Secondary | ICD-10-CM | POA: Insufficient documentation

## 2015-09-22 DIAGNOSIS — F17210 Nicotine dependence, cigarettes, uncomplicated: Secondary | ICD-10-CM | POA: Insufficient documentation

## 2015-09-22 DIAGNOSIS — F329 Major depressive disorder, single episode, unspecified: Secondary | ICD-10-CM | POA: Diagnosis not present

## 2015-09-22 DIAGNOSIS — N898 Other specified noninflammatory disorders of vagina: Secondary | ICD-10-CM | POA: Diagnosis present

## 2015-09-22 DIAGNOSIS — B9689 Other specified bacterial agents as the cause of diseases classified elsewhere: Secondary | ICD-10-CM

## 2015-09-22 LAB — URINALYSIS, ROUTINE W REFLEX MICROSCOPIC
Bilirubin Urine: NEGATIVE
GLUCOSE, UA: NEGATIVE mg/dL
Hgb urine dipstick: NEGATIVE
Ketones, ur: NEGATIVE mg/dL
Leukocytes, UA: NEGATIVE
Nitrite: NEGATIVE
PH: 6.5 (ref 5.0–8.0)
Protein, ur: NEGATIVE mg/dL
Specific Gravity, Urine: 1.02 (ref 1.005–1.030)

## 2015-09-22 LAB — POCT PREGNANCY, URINE: Preg Test, Ur: NEGATIVE

## 2015-09-22 LAB — WET PREP, GENITAL
SPERM: NONE SEEN
Trich, Wet Prep: NONE SEEN
Yeast Wet Prep HPF POC: NONE SEEN

## 2015-09-22 MED ORDER — METRONIDAZOLE 0.75 % VA GEL: 1.0000 | Freq: Every day | VAGINAL | Status: AC

## 2015-09-22 MED ORDER — FLUCONAZOLE 150 MG PO TABS: 150.0000 mg | ORAL_TABLET | Freq: Every day | ORAL | Status: DC

## 2015-09-22 NOTE — Discharge Instructions (Signed)

## 2015-09-22 NOTE — Progress Notes (Signed)
Alphonzo Severance, CNM notified of pt arrival to MAU.  Notified that pt complains of foul smelling vaginal discharge and wants STD testing.  Provider states she is about to deliver another pt and will come see this pt when she is finished.

## 2015-09-22 NOTE — MAU Provider Note (Signed)
History    My Rinke, 20 yo, G1P1001, LMP  09/10/2015 presents to the MAU for vaginal discharge that "has a terrible smell".  Reports unprotected sex with the father of her baby 2 days ago. Desires STD screening.   Denies cramping, pelvic pain, or issues during urination.       Patient Active Problem List   Diagnosis Date Noted  . H/O cesarean section 06/22/2013  . History of pre-eclampsia 06/20/2013  . Drug overdose 06/18/2013    Chief Complaint  Patient presents with  . Vaginal Itching   HPI  OB History    Gravida Para Term Preterm AB TAB SAB Ectopic Multiple Living   Past Medical History  Diagnosis Date  . Medical history non-contributory   . Pre-eclampsia   . Depression     post partum depression for two weeks  . Urinary tract infection     Past Surgical History  Procedure Laterality Date  . Hernia repair    . Cesarean section N/A 06/21/2013    Procedure: CESAREAN SECTION;  Surgeon: Purcell Nails, MD;  Location: [facility];  Service: Obstetrics;  Laterality: N/A;    Family History  Problem Relation Age of Onset  . Diabetes Mother   . Hyperlipidemia Mother   . Asthma Mother   . Hyperlipidemia Father   . Hypertension Father   . Cancer Maternal Aunt     Rectal  . Cancer Maternal Grandmother   . Kidney disease Maternal Grandmother     Social History  Substance Use Topics  . Smoking status: Current Every Day Smoker -- 0.25 packs/day    Types: Cigarettes  . Smokeless tobacco: Never Used  . Alcohol Use: No     Comment: Occasional    Allergies:  Allergies  Allergen Reactions  . Penicillins Hives    Prescriptions prior to admission  Medication Sig Dispense Refill Last Dose  . ciprofloxacin (CIPRO) 500 MG tablet Take 1 tablet (500 mg total) by mouth 2 (two) times daily. (Patient taking differently: Take 500 mg by mouth 2 (two) times daily. Pt started on 02/04/15 to take for 7 days.) 14 tablet 0 02/04/2015 at Unknown time  .  fluconazole (DIFLUCAN) 150 MG tablet Take 1 tablet (150 mg total) by mouth daily. (Patient not taking: Reported on 02/05/2015) 1 tablet 0   . ketorolac (TORADOL) 10 MG tablet Take 1 tablet (10 mg total) by mouth every 6 (six) hours as needed. 20 tablet 0   . medroxyPROGESTERone (PROVERA) 10 MG tablet Take 1 tablet (10 mg total) by mouth daily. 10 tablet 0   . metroNIDAZOLE (FLAGYL) 500 MG tablet Take 1 tablet (500 mg total) by mouth 2 (two) times daily. (Patient taking differently: Take 500 mg by mouth 2 (two) times daily. Pt started on 01/29/15 to take for 7 days.) 14 tablet 0 02/04/2015 at Unknown time  . phenazopyridine (PYRIDIUM) 200 MG tablet Take 1 tablet (200 mg total) by mouth 3 (three) times daily as needed for pain. 12 tablet 0     ROS Physical Exam   Blood pressure 133/63, pulse 97, temperature 98.7 F (37.1 C), temperature source Oral, resp. rate 16, last menstrual period 09/10/2015.  Results for orders placed or performed during the hospital encounter of 09/22/15 (from the past 24 hour(s))  Urinalysis, Routine w reflex microscopic (not at [facility])     Status: None   Collection Time: 09/22/15  4:00  PM  Result Value Ref Range   Color, Urine YELLOW YELLOW   APPearance CLEAR CLEAR   Specific Gravity, Urine 1.020 1.005 - 1.030   pH 6.5 5.0 - 8.0   Glucose, UA NEGATIVE NEGATIVE mg/dL   Hgb urine dipstick NEGATIVE NEGATIVE   Bilirubin Urine NEGATIVE NEGATIVE   Ketones, ur NEGATIVE NEGATIVE mg/dL   Protein, ur NEGATIVE NEGATIVE mg/dL   Nitrite NEGATIVE NEGATIVE   Leukocytes, UA NEGATIVE NEGATIVE  Pregnancy, urine POC     Status: None   Collection Time: 09/22/15  4:07 PM  Result Value Ref Range   Preg Test, Ur NEGATIVE NEGATIVE  Wet prep, genital     Status: Abnormal   Collection Time: 09/22/15  4:40 PM  Result Value Ref Range   Yeast Wet Prep HPF POC NONE SEEN NONE SEEN   Trich, Wet Prep NONE SEEN NONE SEEN   Clue Cells Wet Prep HPF POC PRESENT (A) NONE SEEN   WBC, Wet Prep HPF  POC FEW (A) NONE SEEN   Sperm NONE SEEN       Physical Exam  Constitutional: She is oriented to person, place, and time. She appears well-developed.  HENT:  Head: Normocephalic.  Eyes: Pupils are equal, round, and reactive to light.  Neck: Normal range of motion.  Cardiovascular: Normal rate and regular rhythm.   Respiratory: Effort normal.  GI: Soft.  Genitourinary: Uterus is not tender. Cervix exhibits no discharge. Right adnexum displays no tenderness. Left adnexum displays no tenderness. No tenderness or bleeding in the vagina. Vaginal discharge found.  Musculoskeletal: Normal range of motion.  Neurological: She is alert and oriented to person, place, and time. She has normal reflexes.  Skin: Skin is warm and dry.  Psychiatric: She has a normal mood and affect. Her behavior is normal. Judgment and thought content normal.    ED Course  Assessment: Bacterial Vaginosis UPT negative CT/GC pending HIV pending RPR pending HepC pending    Plan: Dc home Rx metrogel x7 days Rx Diflucan x1  Will call with positive results for pending labs Call/ Follow up in office Prn if symptoms worsen    Alphonzo Severance CNM, MSN 09/22/2015 4:14 PM

## 2015-09-22 NOTE — Progress Notes (Signed)
Alphonzo Severance, CNM called to determine when discharge will be put in.  Provider states she is working on it.

## 2015-09-22 NOTE — Progress Notes (Signed)
Alphonzo Severance, CNM notified of wet prep results.  Provider states she will put in prescriptions and discharge orders.

## 2015-09-22 NOTE — MAU Note (Signed)
Pt states she is having vaginal itching and has a smell to the discharge.  Pt states she has no burning with urination.  Pt states she wants STD tests.

## 2015-09-23 LAB — GC/CHLAMYDIA PROBE AMP (~~LOC~~) NOT AT ARMC
Chlamydia: NEGATIVE
Neisseria Gonorrhea: NEGATIVE

## 2015-09-23 LAB — HIV ANTIBODY (ROUTINE TESTING W REFLEX): HIV Screen 4th Generation wRfx: NONREACTIVE

## 2015-09-23 LAB — RPR: RPR: NONREACTIVE

## 2015-09-23 LAB — HEPATITIS C ANTIBODY

## 2015-12-08 ENCOUNTER — Inpatient Hospital Stay (HOSPITAL_COMMUNITY)
Admission: AD | Admit: 2015-12-08 | Discharge: 2015-12-08 | Disposition: A | Discharge status: Home / Self Care | Payer: Medicaid Other | Source: Ambulatory Visit | Attending: Obstetrics and Gynecology | Admitting: Obstetrics and Gynecology

## 2015-12-08 ENCOUNTER — Encounter (HOSPITAL_COMMUNITY): Payer: Self-pay | Admitting: *Deleted

## 2015-12-08 DIAGNOSIS — B9689 Other specified bacterial agents as the cause of diseases classified elsewhere: Secondary | ICD-10-CM | POA: Insufficient documentation

## 2015-12-08 DIAGNOSIS — A499 Bacterial infection, unspecified: Secondary | ICD-10-CM

## 2015-12-08 DIAGNOSIS — N760 Acute vaginitis: Secondary | ICD-10-CM | POA: Diagnosis not present

## 2015-12-08 HISTORY — DX: Gestational (pregnancy-induced) hypertension without significant proteinuria, unspecified trimester: O13.9

## 2015-12-08 HISTORY — DX: Unspecified infectious disease: B99.9

## 2015-12-08 LAB — URINALYSIS, ROUTINE W REFLEX MICROSCOPIC
BILIRUBIN URINE: NEGATIVE
Glucose, UA: NEGATIVE mg/dL
Ketones, ur: NEGATIVE mg/dL
Leukocytes, UA: NEGATIVE
Nitrite: NEGATIVE
Protein, ur: NEGATIVE mg/dL
Specific Gravity, Urine: >1.03 — ABNORMAL HIGH (ref 1.005–1.030)
pH: 5.5 (ref 5.0–8.0)

## 2015-12-08 LAB — URINE MICROSCOPIC-ADD ON: RBC / HPF: NONE SEEN RBC/hpf (ref 0–5)

## 2015-12-08 LAB — WET PREP, GENITAL
SPERM: NONE SEEN
Trich, Wet Prep: NONE SEEN
Yeast Wet Prep HPF POC: NONE SEEN

## 2015-12-08 LAB — HIV ANTIBODY (ROUTINE TESTING W REFLEX): HIV Screen 4th Generation wRfx: NONREACTIVE

## 2015-12-08 LAB — POCT PREGNANCY, URINE: PREG TEST UR: NEGATIVE

## 2015-12-08 LAB — RPR: RPR: NONREACTIVE

## 2015-12-08 LAB — HEPATITIS B SURFACE ANTIGEN: HEP B S AG: NEGATIVE

## 2015-12-08 MED ORDER — METRONIDAZOLE 0.75 % VA GEL: 1.0000 | Freq: Every day | VAGINAL | Status: DC

## 2015-12-08 NOTE — MAU Provider Note (Signed)
Chief Complaint: Vaginal Discharge   None     SUBJECTIVE HPI: Natalie Chavez is a 20 y.o. G1P1001 at Unknown by LMP who presents to maternity admissions reporting vaginal discharge with odor and wants STD testing.  She reports frequent bacterial vaginosis and prefer topical treatment since she has trouble swallowing pills.  She denies any associated symptoms, and denies abdominal pain, vaginal bleeding, or fever/chills.  She denies vaginal itching/burning, urinary symptoms, h/a, dizziness, or n/v.   HPI  Past Medical History  Diagnosis Date  . Medical history non-contributory   . Pre-eclampsia   . Urinary tract infection   . Pregnancy induced hypertension   . Infection     UTI  . Depression     post partum depression for two weeks   Past Surgical History  Procedure Laterality Date  . Hernia repair    . Cesarean section N/A 06/21/2013    Procedure: CESAREAN SECTION;  Surgeon: Purcell Nails, MD;  Location: [facility];  Service: Obstetrics;  Laterality: N/A;   Social History   Social History  . Marital Status: Single    Spouse Name: N/A  . Number of Children: N/A  . Years of Education: N/A   Occupational History  . Not on file.   Social History Main Topics  . Smoking status: Current Every Day Smoker -- 0.25 packs/day for 1 years    Types: Cigarettes  . Smokeless tobacco: Never Used  . Alcohol Use: Yes     Comment: Occasional  . Drug Use: No  . Sexual Activity: Yes    Birth Control/ Protection: None, Condom     Comment: last sex, 16 Jun 16    Other Topics Concern  . Not on file   Social History Narrative   No current facility-administered medications on file prior to encounter.   Current Outpatient Prescriptions on File Prior to Encounter  Medication Sig Dispense Refill  . fluconazole (DIFLUCAN) 150 MG tablet Take 1 tablet (150 mg total) by mouth daily. (Patient not taking: Reported on 12/08/2015) 1 tablet 0   Allergies  Allergen Reactions  . Penicillins  Hives and Other (See Comments)    Has patient had a PCN reaction causing immediate rash, facial/tongue/throat swelling, SOB or lightheadedness with hypotension: No Has patient had a PCN reaction causing severe rash involving mucus membranes or skin necrosis: No Has patient had a PCN reaction that required hospitalization No Has patient had a PCN reaction occurring within the last 10 years: No If all of the above answers are "NO", then may proceed with Cephalosporin use.    ROS:  Review of Systems  Constitutional: Negative for fever, chills and fatigue.  Respiratory: Negative for shortness of breath.   Cardiovascular: Negative for chest pain.  Genitourinary: Positive for vaginal discharge. Negative for dysuria, flank pain, vaginal bleeding, difficulty urinating, vaginal pain and pelvic pain.  Neurological: Negative for dizziness and headaches.  Psychiatric/Behavioral: Negative.      I have reviewed patient's Past Medical Hx, Surgical Hx, Family Hx, Social Hx, medications and allergies.   Physical Exam  Patient Vitals for the past 24 hrs:  BP Temp Temp src Pulse Resp Weight  12/08/15 0732 114/75 mmHg 97.8 F (36.6 C) Oral 91 18 206 lb 4 oz (93.554 kg)   Constitutional: Well-developed, well-nourished female in no acute distress.  Cardiovascular: normal rate Respiratory: normal effort GI: Abd soft, non-tender. Pos BS x 4 MS: Extremities nontender, no edema, normal ROM Neurologic: Alert and oriented x 4.  GU:  Neg CVAT.  PELVIC EXAM: Cervix pink, visually closed, without lesion, small amount thin white malodorous discharge, vaginal walls and external genitalia normal Bimanual exam: Cervix 0/long/high, firm, anterior, neg CMT, uterus nontender, nonenlarged, adnexa without tenderness, enlargement, or mass   LAB RESULTS Results for orders placed or performed during the hospital encounter of 12/08/15 (from the past 24 hour(s))  Urinalysis, Routine w reflex microscopic ([facility])      Status: Abnormal   Collection Time: 12/08/15  7:30 AM  Result Value Ref Range   Color, Urine YELLOW YELLOW   APPearance CLEAR CLEAR   Specific Gravity, Urine >1.030 (H) 1.005 - 1.030   pH 5.5 5.0 - 8.0   Glucose, UA NEGATIVE NEGATIVE mg/dL   Hgb urine dipstick TRACE (A) NEGATIVE   Bilirubin Urine NEGATIVE NEGATIVE   Ketones, ur NEGATIVE NEGATIVE mg/dL   Protein, ur NEGATIVE NEGATIVE mg/dL   Nitrite NEGATIVE NEGATIVE   Leukocytes, UA NEGATIVE NEGATIVE  Urine microscopic-add on     Status: Abnormal   Collection Time: 12/08/15  7:30 AM  Result Value Ref Range   Squamous Epithelial / LPF TOO NUMEROUS TO COUNT (A) NONE SEEN   WBC, UA 6-30 0 - 5 WBC/hpf   RBC / HPF NONE SEEN 0 - 5 RBC/hpf   Bacteria, UA FEW (A) NONE SEEN   Urine-Other MUCOUS PRESENT   Pregnancy, urine POC     Status: None   Collection Time: 12/08/15  7:41 AM  Result Value Ref Range   Preg Test, Ur NEGATIVE NEGATIVE  Wet prep, genital     Status: Abnormal   Collection Time: 12/08/15  8:45 AM  Result Value Ref Range   Yeast Wet Prep HPF POC NONE SEEN NONE SEEN   Trich, Wet Prep NONE SEEN NONE SEEN   Clue Cells Wet Prep HPF POC PRESENT (A) NONE SEEN   WBC, Wet Prep HPF POC FEW (A) NONE SEEN   Sperm NONE SEEN        IMAGING No results found.  MAU Management/MDM: Ordered labs and reviewed results.  Clue cells present today, consistent with pt history and today's physical exam.  STD testing pending.  Consult Dr Richardson Doppole. Will treat for BV with Metrogel Q HS x 5 nights.  Pt stable at time of discharge.  ASSESSMENT 1. BV (bacterial vaginosis)     PLAN Discharge home GCC, RPR, HIV, Hep C pending    Medication List    STOP taking these medications        fluconazole 150 MG tablet  Commonly known as:  DIFLUCAN      TAKE these medications        acetaminophen 325 MG tablet  Commonly known as:  TYLENOL  Take 650 mg by mouth every 6 (six) hours as needed for moderate pain.     EXCEDRIN MIGRAINE 250-250-65  MG tablet  Generic drug:  aspirin-acetaminophen-caffeine  Take 1 tablet by mouth every 6 (six) hours as needed for headache.     metroNIDAZOLE 0.75 % vaginal gel  Commonly known as:  METROGEL  Place 1 Applicatorful vaginally at bedtime. Apply one applicatorful to vagina at bedtime for 5 days           Follow-up Information    Follow up with Purcell NailsOBERTS,ANGELA Y, MD.   Specialty:  Obstetrics and Gynecology   Why:  As needed, Return to MAU as needed for emergencies   Contact information:   [address] [place] [address] [phone]  Sharen Counter Certified Nurse-Midwife 12/08/2015  9:44 AM

## 2015-12-08 NOTE — MAU Note (Addendum)
Feel like she has a yeast or a bacterial infection.  "It's bad down there'.  Has a d/c and an odor and itching.  Would also like to have testing for other STD's and HIV.

## 2015-12-09 LAB — GC/CHLAMYDIA PROBE AMP (~~LOC~~) NOT AT ARMC
Chlamydia: NEGATIVE
NEISSERIA GONORRHEA: NEGATIVE

## 2016-02-25 DIAGNOSIS — F17210 Nicotine dependence, cigarettes, uncomplicated: Secondary | ICD-10-CM | POA: Insufficient documentation

## 2016-02-25 DIAGNOSIS — N72 Inflammatory disease of cervix uteri: Secondary | ICD-10-CM | POA: Insufficient documentation

## 2016-02-25 DIAGNOSIS — B9689 Other specified bacterial agents as the cause of diseases classified elsewhere: Secondary | ICD-10-CM | POA: Insufficient documentation

## 2016-02-25 DIAGNOSIS — N760 Acute vaginitis: Secondary | ICD-10-CM | POA: Insufficient documentation

## 2016-02-25 LAB — POC URINE PREG, ED: PREG TEST UR: NEGATIVE

## 2016-02-25 NOTE — ED Notes (Signed)
Per Pt she started having greenish discharge and irritation with foul odor; Pt is unsure if any sexual partner has STD; pt want to be checked for STD; Pt denies pain;  Pt states naueas started around the same time. LMP 01/31/16; Pt a&ox 4 on arrival.

## 2016-02-26 ENCOUNTER — Emergency Department (HOSPITAL_COMMUNITY)
Admission: EM | Admit: 2016-02-26 | Discharge: 2016-02-26 | Disposition: A | Discharge status: Home / Self Care | Payer: Medicaid Other | Attending: Emergency Medicine | Admitting: Emergency Medicine

## 2016-02-26 DIAGNOSIS — N760 Acute vaginitis: Secondary | ICD-10-CM

## 2016-02-26 DIAGNOSIS — N72 Inflammatory disease of cervix uteri: Secondary | ICD-10-CM

## 2016-02-26 DIAGNOSIS — B9689 Other specified bacterial agents as the cause of diseases classified elsewhere: Secondary | ICD-10-CM

## 2016-02-26 LAB — WET PREP, GENITAL
SPERM: NONE SEEN
Trich, Wet Prep: NONE SEEN
Yeast Wet Prep HPF POC: NONE SEEN

## 2016-02-26 LAB — URINALYSIS, ROUTINE W REFLEX MICROSCOPIC
Bilirubin Urine: NEGATIVE
GLUCOSE, UA: NEGATIVE mg/dL
Ketones, ur: NEGATIVE mg/dL
Leukocytes, UA: NEGATIVE
Nitrite: NEGATIVE
Protein, ur: NEGATIVE mg/dL
SPECIFIC GRAVITY, URINE: 1.027 (ref 1.005–1.030)
pH: 5.5 (ref 5.0–8.0)

## 2016-02-26 LAB — GC/CHLAMYDIA PROBE AMP (~~LOC~~) NOT AT ARMC
Chlamydia: NEGATIVE
Neisseria Gonorrhea: NEGATIVE

## 2016-02-26 LAB — URINE MICROSCOPIC-ADD ON

## 2016-02-26 MED ORDER — CEFTRIAXONE SODIUM 250 MG IJ SOLR
250.0000 mg | Freq: Once | INTRAMUSCULAR | Status: AC
  Administered 2016-02-26: 250 mg via INTRAMUSCULAR
  Filled 2016-02-26: qty 250

## 2016-02-26 MED ORDER — ONDANSETRON 4 MG PO TBDP
4.0000 mg | ORAL_TABLET | Freq: Once | ORAL | Status: AC
  Administered 2016-02-26: 4 mg via ORAL
  Filled 2016-02-26: qty 1

## 2016-02-26 MED ORDER — METRONIDAZOLE 0.75 % VA GEL: 1.0000 | Freq: Every day | VAGINAL | Status: DC

## 2016-02-26 MED ORDER — AZITHROMYCIN 250 MG PO TABS
1000.0000 mg | ORAL_TABLET | Freq: Once | ORAL | Status: AC
  Administered 2016-02-26: 1000 mg via ORAL
  Filled 2016-02-26: qty 4

## 2016-02-26 MED ORDER — STERILE WATER FOR INJECTION IJ SOLN
INTRAMUSCULAR | Status: AC
  Administered 2016-02-26: 10 mL
  Filled 2016-02-26: qty 10

## 2016-02-26 NOTE — Discharge Instructions (Signed)
Bacterial Vaginosis °Bacterial vaginosis is a vaginal infection that occurs when the normal balance of bacteria in the vagina is disrupted. It results from an overgrowth of certain bacteria. This is the most common vaginal infection in women of childbearing age. Treatment is important to prevent complications, especially in pregnant women, as it can cause a premature delivery. °CAUSES  °Bacterial vaginosis is caused by an increase in harmful bacteria that are normally present in smaller amounts in the vagina. Several different kinds of bacteria can cause bacterial vaginosis. However, the reason that the condition develops is not fully understood. °RISK FACTORS °Certain activities or behaviors can put you at an increased risk of developing bacterial vaginosis, including: °· Having a new sex partner or multiple sex partners. °· Douching. °· Using an intrauterine device (IUD) for contraception. °Women do not get bacterial vaginosis from toilet seats, bedding, swimming pools, or contact with objects around them. °SIGNS AND SYMPTOMS  °Some women with bacterial vaginosis have no signs or symptoms. Common symptoms include: °· Grey vaginal discharge. °· A fishlike odor with discharge, especially after sexual intercourse. °· Itching or burning of the vagina and vulva. °· Burning or pain with urination. °DIAGNOSIS  °Your health care provider will take a medical history and examine the vagina for signs of bacterial vaginosis. A sample of vaginal fluid may be taken. Your health care provider will look at this sample under a microscope to check for bacteria and abnormal cells. A vaginal pH test may also be done.  °TREATMENT  °Bacterial vaginosis may be treated with antibiotic medicines. These may be given in the form of a pill or a vaginal cream. A second round of antibiotics may be prescribed if the condition comes back after treatment. Because bacterial vaginosis increases your risk for sexually transmitted diseases, getting  treated can help reduce your risk for chlamydia, gonorrhea, HIV, and herpes. °HOME CARE INSTRUCTIONS  °· Only take over-the-counter or prescription medicines as directed by your health care provider. °· If antibiotic medicine was prescribed, take it as directed. Make sure you finish it even if you start to feel better. °· Tell all sexual partners that you have a vaginal infection. They should see their health care provider and be treated if they have problems, such as a mild rash or itching. °· During treatment, it is important that you follow these instructions: °¨ Avoid sexual activity or use condoms correctly. °¨ Do not douche. °¨ Avoid alcohol as directed by your health care provider. °¨ Avoid breastfeeding as directed by your health care provider. °SEEK MEDICAL CARE IF:  °· Your symptoms are not improving after 3 days of treatment. °· You have increased discharge or pain. °· You have a fever. °MAKE SURE YOU:  °· Understand these instructions. °· Will watch your condition. °· Will get help right away if you are not doing well or get worse. °FOR MORE INFORMATION  °Centers for Disease Control and Prevention, Division of STD Prevention: www.cdc.gov/std °American Sexual Health Association (ASHA): www.ashastd.org  °  °This information is not intended to replace advice given to you by your health care provider. Make sure you discuss any questions you have with your health care provider. °  °Document Released: 07/27/2005 Document Revised: 08/17/2014 Document Reviewed: 03/08/2013 °Elsevier Interactive Patient Education ©2016 [employer] ° °Cervicitis °Cervicitis is a soreness and swelling (inflammation) of the cervix. Your cervix is located at the bottom of your uterus. It opens up to the vagina. °CAUSES  °· Sexually transmitted infections (STIs).   °·   Allergic reaction.   °· Medicines or birth control devices that are put in the vagina.   °· Injury to the cervix.   °· Bacterial infections.   °RISK FACTORS °You are at  greater risk if you: °· Have unprotected sexual intercourse. °· Have sexual intercourse with many partners. °· Began sexual intercourse at an early age. °· Have a history of STIs. °SYMPTOMS  °There may be no symptoms. If symptoms occur, they may include:  °· Gray, white, yellow, or bad-smelling vaginal discharge.   °· Pain or itching of the area outside the vagina.   °· Painful sexual intercourse.   °· Lower abdominal or lower back pain, especially during intercourse.   °· Frequent urination.   °· Abnormal vaginal bleeding between periods, after sexual intercourse, or after menopause.   °· Pressure or a heavy feeling in the pelvis.   °DIAGNOSIS  °Diagnosis is made after a pelvic exam. Other tests may include:  °· Examination of any discharge under a microscope (wet prep).   °· A Pap test.   °TREATMENT  °Treatment will depend on the cause of cervicitis. If it is caused by an STI, both you and your partner will need to be treated. Antibiotic medicines will be given.  °HOME CARE INSTRUCTIONS  °· Do not have sexual intercourse until your health care provider says it is okay.   °· Do not have sexual intercourse until your partner has been treated, if your cervicitis is caused by an STI.   °· Take your antibiotics as directed. Finish them even if you start to feel better.   °SEEK MEDICAL CARE IF: °· Your symptoms come back.   °· You have a fever.   °MAKE SURE YOU:  °· Understand these instructions. °· Will watch your condition. °· Will get help right away if you are not doing well or get worse. °  °This information is not intended to replace advice given to you by your health care provider. Make sure you discuss any questions you have with your health care provider. °  °Document Released: 07/27/2005 Document Revised: 08/01/2013 Document Reviewed: 01/18/2013 °Elsevier Interactive Patient Education ©2016 [employer]vier Inc. ° °

## 2016-02-26 NOTE — ED Provider Notes (Signed)
CSN: [id number]     Arrival date & time 02/25/16  2207 History  By signing my name below, I, Natalie Chavez, attest that this documentation has been prepared under the direction and in the presence of Laurence Spatesachel Morgan Susanne Baumgarner, MD. Electronically Signed: Bethel BornBritney Chavez, ED Scribe. 02/26/2016. 4:15 AM  Chief Complaint  Patient presents with  . Emesis  . SEXUALLY TRANSMITTED DISEASE   The history is provided by the patient. No language interpreter was used.   Natalie Dorothy PufferDanielle Deshazer is a 20 y.o. female who presents to the Emergency Department complaining of yellow/green malodorous vaginal discharge with onset 1 week ago. Associated symptoms include nausea. Pt denies abdominal pain, vomiting, dysuria, difficulty urinating, or fevers. She last had unprotected sex 2 weeks ago. In the past she has been treated for bacterial vaginosis but denies douching and frequent bubble bathing. Previously she has had hives with penicillin.   Past Medical History  Diagnosis Date  . Medical history non-contributory   . Pre-eclampsia   . Urinary tract infection   . Pregnancy induced hypertension   . Infection     UTI  . Depression     post partum depression for two weeks   Past Surgical History  Procedure Laterality Date  . Hernia repair    . Cesarean section N/A 06/21/2013    Procedure: CESAREAN SECTION;  Surgeon: Purcell NailsAngela Y Roberts, MD;  Location: [facility];  Service: Obstetrics;  Laterality: N/A;   Family History  Problem Relation Age of Onset  . Diabetes Mother   . Hyperlipidemia Mother   . Asthma Mother   . Hypertension Mother   . Hyperlipidemia Father   . Hypertension Father   . Cancer Maternal Aunt     Rectal  . Hypertension Maternal Aunt   . Cancer Maternal Grandmother   . Kidney disease Maternal Grandmother    Social History  Substance Use Topics  . Smoking status: Current Every Day Smoker -- 0.25 packs/day for 1 years    Types: Cigarettes  . Smokeless tobacco: Never Used  . Alcohol Use: Yes      Comment: Occasional   OB History    Gravida Para Term Preterm AB TAB SAB Ectopic Multiple Living   1 1 1       1      Review of Systems  10 Systems reviewed and all are negative for acute change except as noted in the HPI.  Allergies  Penicillins  Home Medications   Prior to Admission medications   Medication Sig Start Date End Date Taking? Authorizing Provider  metroNIDAZOLE (METROGEL) 0.75 % vaginal gel Place 1 Applicatorful vaginally at bedtime. For 5 days 02/26/16   Ambrose Finlandachel Morgan Elizeth Weinrich, MD   BP 117/79 mmHg  Pulse 87  Temp(Src) 98.2 F (36.8 C) (Oral)  Resp 18  Ht 5\' 5"  (1.651 m)  Wt 212 lb (96.163 kg)  BMI 35.28 kg/m2  SpO2 100%  LMP 01/31/2016 Physical Exam  Constitutional: She is oriented to person, place, and time. She appears well-developed and well-nourished. No distress.  HENT:  Head: Normocephalic and atraumatic.  Moist mucous membranes  Eyes: Conjunctivae are normal. Pupils are equal, round, and reactive to light.  Neck: Neck supple.  Cardiovascular: Normal rate, regular rhythm and normal heart sounds.   No murmur heard. Pulmonary/Chest: Effort normal and breath sounds normal.  Abdominal: Soft. Bowel sounds are normal. She exhibits no distension. There is no tenderness.  Genitourinary:  Copious white, frothy, foul-smelling discharge in vaginal vault, no cervical motion or  adnexal tenderness  Musculoskeletal: She exhibits no edema.  Neurological: She is alert and oriented to person, place, and time.  Fluent speech  Skin: Skin is warm and dry.  Psychiatric: She has a normal mood and affect. Judgment normal.  Nursing note and vitals reviewed. Chaperone was present during exam.   ED Course  Procedures (including critical care time) DIAGNOSTIC STUDIES: Oxygen Saturation is 100% on RA,  normal by my interpretation.    COORDINATION OF CARE: 2:28 AM Discussed treatment plan which includes pelvic exam and lab work with pt at bedside and pt agreed to  plan.  Labs Review Labs Reviewed  WET PREP, GENITAL - Abnormal; Notable for the following:    Clue Cells Wet Prep HPF POC PRESENT (*)    WBC, Wet Prep HPF POC MANY (*)    All other components within normal limits  URINALYSIS, ROUTINE W REFLEX MICROSCOPIC (NOT AT [facility]) - Abnormal; Notable for the following:    APPearance CLOUDY (*)    Hgb urine dipstick TRACE (*)    All other components within normal limits  URINE MICROSCOPIC-ADD ON - Abnormal; Notable for the following:    Squamous Epithelial / LPF 6-30 (*)    Bacteria, UA MANY (*)    Casts HYALINE CASTS (*)    Crystals CA OXALATE CRYSTALS (*)    All other components within normal limits  POC URINE PREG, ED  GC/CHLAMYDIA PROBE AMP ([place]) NOT AT [facility]    Imaging Review No results found. I have personally reviewed and evaluated these lab results as part of my medical decision-making.   EKG Interpretation None      MDM   Final diagnoses:  Cervicitis  BV (bacterial vaginosis)   Patient with 1 week of foul-smelling vaginal discharge, recent unprotected sex. No abdominal tenderness, vomiting, or other infectious symptoms. She was well-appearing with normal vital signs. No abdominal tenderness. Large amount of white frothy discharge on pelvic exam, no tenderness to suggest PID. Wet prep shows clue cells. Provided with prescription for metronidazole gel per patient request. Because of her recent unprotected intercourse, also treated for STD with ceftriaxone and azithromycin. Return precautions reviewed. Provided with OB/GYN follow-up information for women's clinic. Patient voiced understanding and was discharged in satisfactory condition.  I personally performed the services described in this documentation, which was scribed in my presence. The recorded information has been reviewed and is accurate.   Laurence Spates, MD 02/26/16 [phone]

## 2016-05-15 ENCOUNTER — Emergency Department (HOSPITAL_COMMUNITY)
Admission: EM | Admit: 2016-05-15 | Discharge: 2016-05-15 | Disposition: A | Discharge status: Home / Self Care | Payer: Medicaid Other | Attending: Emergency Medicine | Admitting: Emergency Medicine

## 2016-05-15 ENCOUNTER — Encounter (HOSPITAL_COMMUNITY): Payer: Self-pay

## 2016-05-15 ENCOUNTER — Emergency Department (HOSPITAL_COMMUNITY): Payer: Medicaid Other

## 2016-05-15 DIAGNOSIS — J209 Acute bronchitis, unspecified: Secondary | ICD-10-CM | POA: Insufficient documentation

## 2016-05-15 DIAGNOSIS — F17210 Nicotine dependence, cigarettes, uncomplicated: Secondary | ICD-10-CM | POA: Insufficient documentation

## 2016-05-15 LAB — BASIC METABOLIC PANEL
ANION GAP: 10 (ref 5–15)
BUN: 13 mg/dL (ref 6–20)
CHLORIDE: 103 mmol/L (ref 101–111)
CO2: 22 mmol/L (ref 22–32)
Calcium: 9.3 mg/dL (ref 8.9–10.3)
Creatinine, Ser: 0.72 mg/dL (ref 0.44–1.00)
Glucose, Bld: 100 mg/dL — ABNORMAL HIGH (ref 65–99)
POTASSIUM: 4.1 mmol/L (ref 3.5–5.1)
SODIUM: 135 mmol/L (ref 135–145)

## 2016-05-15 LAB — CBC
HEMATOCRIT: 39.7 % (ref 36.0–46.0)
HEMOGLOBIN: 13.1 g/dL (ref 12.0–15.0)
MCH: 29.6 pg (ref 26.0–34.0)
MCHC: 33 g/dL (ref 30.0–36.0)
MCV: 89.8 fL (ref 78.0–100.0)
Platelets: 193 10*3/uL (ref 150–400)
RBC: 4.42 MIL/uL (ref 3.87–5.11)
RDW: 13 % (ref 11.5–15.5)
WBC: 8.6 10*3/uL (ref 4.0–10.5)

## 2016-05-15 LAB — I-STAT TROPONIN, ED: Troponin i, poc: 0 ng/mL (ref 0.00–0.08)

## 2016-05-15 MED ORDER — AZITHROMYCIN 250 MG PO TABS: 250.0000 mg | ORAL_TABLET | Freq: Every day | ORAL | 0 refills | Status: DC

## 2016-05-15 MED ORDER — AZITHROMYCIN 250 MG PO TABS
500.0000 mg | ORAL_TABLET | Freq: Once | ORAL | Status: AC
  Administered 2016-05-15: 500 mg via ORAL
  Filled 2016-05-15: qty 2

## 2016-05-15 MED ORDER — HYDROCODONE-HOMATROPINE 5-1.5 MG/5ML PO SYRP: 5.0000 mL | ORAL_SOLUTION | Freq: Four times a day (QID) | ORAL | 0 refills | Status: DC | PRN

## 2016-05-15 MED ORDER — PREDNISONE 20 MG PO TABS: 40.0000 mg | ORAL_TABLET | Freq: Every day | ORAL | 0 refills | Status: DC

## 2016-05-15 MED ORDER — PREDNISONE 20 MG PO TABS
40.0000 mg | ORAL_TABLET | Freq: Once | ORAL | Status: AC
  Administered 2016-05-15: 40 mg via ORAL
  Filled 2016-05-15: qty 2

## 2016-05-15 MED ORDER — ALBUTEROL SULFATE (2.5 MG/3ML) 0.083% IN NEBU
2.5000 mg | INHALATION_SOLUTION | Freq: Once | RESPIRATORY_TRACT | Status: AC
Start: 2016-05-15 — End: 2016-05-15
  Administered 2016-05-15: 2.5 mg via RESPIRATORY_TRACT
  Filled 2016-05-15: qty 3

## 2016-05-15 MED ORDER — ALBUTEROL SULFATE HFA 108 (90 BASE) MCG/ACT IN AERS
2.0000 | INHALATION_SPRAY | RESPIRATORY_TRACT | Status: DC | PRN
  Administered 2016-05-15: 2 via RESPIRATORY_TRACT
  Filled 2016-05-15: qty 6.7

## 2016-05-15 NOTE — ED Provider Notes (Signed)
[facility]-EMERGENCY DEPT Provider Note   CSN: [id number] Arrival date & time: 05/15/16  0351     [id number]   Chief Complaint Chief Complaint  Patient presents with  . Abdominal Pain  . Chest Pain    HPI Natalie Chavez is a 20 y.o. female.  Patient reports that she has been experiencing cough, nasal congestion, headache, chills for 2 days. Reports that her father is in the ICU currently and she has been visiting him continuously, thinks she might of been exposed to something here at the hospital. Patient reports the cough has been persistent and is worsening. She has soreness across her chest now with coughing and she reports that she coughs so violently that she gags. She is feeling like she cannot breathe well because her lungs are tight. She hasn't documented any fevers but has had chills and shakes tonight which was what brought her to the ER.      Past Medical History:  Diagnosis Date  . Depression    post partum depression for two weeks  . Infection    UTI  . Medical history non-contributory   . Pre-eclampsia   . Pregnancy induced hypertension   . Urinary tract infection     Patient Active Problem List   Diagnosis Date Noted  . H/O cesarean section 06/22/2013  . History of pre-eclampsia 06/20/2013  . Drug overdose 06/18/2013    Past Surgical History:  Procedure Laterality Date  . CESAREAN SECTION N/A 06/21/2013   Procedure: CESAREAN SECTION;  Surgeon: Purcell NailsAngela Y Roberts, MD;  Location: [facility];  Service: Obstetrics;  Laterality: N/A;  . HERNIA REPAIR      OB History    Gravida Para Term Preterm AB Living   2 1 1     1    SAB TAB Ectopic Multiple Live Births           1       Home Medications    Prior to Admission medications   Medication Sig Start Date End Date Taking? Authorizing Provider  ibuprofen (ADVIL,MOTRIN) 200 MG tablet Take 200 mg by mouth every 6 (six) hours as needed for mild pain.   Yes Historical Provider, MD  metroNIDAZOLE (METROGEL)  0.75 % vaginal gel Place 1 Applicatorful vaginally at bedtime. For 5 days Patient not taking: Reported on 05/15/2016 02/26/16   Laurence Spatesachel Morgan Little, MD    Family History Family History  Problem Relation Age of Onset  . Diabetes Mother   . Hyperlipidemia Mother   . Asthma Mother   . Hypertension Mother   . Hyperlipidemia Father   . Hypertension Father   . Cancer Maternal Grandmother   . Kidney disease Maternal Grandmother   . Cancer Maternal Aunt     Rectal  . Hypertension Maternal Aunt     Social History Social History  Substance Use Topics  . Smoking status: Current Every Day Smoker    Packs/day: 0.25    Years: 1.00    Types: Cigarettes  . Smokeless tobacco: Never Used  . Alcohol use Yes     Comment: Occasional     Allergies   Penicillins   Review of Systems Review of Systems  Constitutional: Positive for chills and fatigue.  HENT: Positive for congestion.   Respiratory: Positive for cough.   Cardiovascular: Positive for chest pain.  Musculoskeletal: Positive for myalgias.  All other systems reviewed and are negative.    Physical Exam Updated Vital Signs BP 113/74   Pulse 116   Temp  99.5 F (37.5 C) (Oral)   Resp 21   Ht 5\' 5"  (1.651 m)   Wt 206 lb (93.4 kg)   LMP 04/21/2016   SpO2 100%   BMI 34.28 kg/m   Physical Exam  Constitutional: She is oriented to person, place, and time. She appears well-developed and well-nourished. No distress.  HENT:  Head: Normocephalic and atraumatic.  Right Ear: Hearing normal.  Left Ear: Hearing normal.  Nose: Nose normal.  Mouth/Throat: Oropharynx is clear and moist and mucous membranes are normal.  Eyes: Conjunctivae and EOM are normal. Pupils are equal, round, and reactive to light.  Neck: Normal range of motion. Neck supple.  Cardiovascular: Regular rhythm, S1 normal and S2 normal.  Exam reveals no gallop and no friction rub.   No murmur heard. Pulmonary/Chest: Effort normal. No respiratory distress. She has  decreased breath sounds. She exhibits no tenderness.  Abdominal: Soft. Normal appearance and bowel sounds are normal. There is no hepatosplenomegaly. There is no tenderness. There is no rebound, no guarding, no tenderness at McBurney's point and negative Murphy's sign. No hernia.  Musculoskeletal: Normal range of motion.  Neurological: She is alert and oriented to person, place, and time. She has normal strength. No cranial nerve deficit or sensory deficit. Coordination normal. GCS eye subscore is 4. GCS verbal subscore is 5. GCS motor subscore is 6.  Skin: Skin is warm, dry and intact. No rash noted. No cyanosis.  Psychiatric: She has a normal mood and affect. Her speech is normal and behavior is normal. Thought content normal.  Nursing note and vitals reviewed.    ED Treatments / Results  Labs (all labs ordered are listed, but only abnormal results are displayed) Labs Reviewed  BASIC METABOLIC PANEL - Abnormal; Notable for the following:       Result Value   Glucose, Bld 100 (*)    All other components within normal limits  CBC  I-STAT TROPOININ, ED    EKG  EKG Interpretation  Date/Time:  Friday May 15 2016 04:08:45 EDT Ventricular Rate:  133 PR Interval:  120 QRS Duration: 80 QT Interval:  284 QTC Calculation: 422 R Axis:   81 Text Interpretation:  Sinus tachycardia Nonspecific T wave abnormality Abnormal ECG When compared with ECG of 01/14/2014, Nonspecific T wave abnormality is now Present Confirmed by [facility]  MD, DAVID ([id number]) on 05/15/2016 4:15:28 AM       Radiology Dg Chest 2 View  Result Date: 05/15/2016 CLINICAL DATA:  Cough, chest soreness and abdominal pain for 2 days. EXAM: CHEST  2 VIEW COMPARISON:  Chest radiograph January 14, 2014 FINDINGS: Cardiomediastinal silhouette is normal. No pleural effusions or focal consolidations. Trachea projects midline and there is no pneumothorax. Soft tissue planes and included osseous structures are non-suspicious. IMPRESSION:  Normal chest. Electronically Signed   By: Awilda Metro M.D.   On: 05/15/2016 04:45    Procedures Procedures (including critical care time)  Medications Ordered in ED Medications  albuterol (PROVENTIL) (2.5 MG/3ML) 0.083% nebulizer solution 2.5 mg (2.5 mg Nebulization Given 05/15/16 0526)     Initial Impression / Assessment and Plan / ED Course  I have reviewed the triage vital signs and the nursing notes.  Pertinent labs & imaging results that were available during my care of the patient were reviewed by me and considered in my medical decision making (see chart for details).  Clinical Course   Presents to the [facility] with 2 day history of progressively worsening cough, chest congestion, head congestion, chills  and flulike illness. Patient reports that she has had nausea and gagging, mostly secondary to response to her forceful coughing. She is now feeling soreness in her ribs and abdomen whenever she coughs. Chest x-ray does not show pneumonia. Lab work is unremarkable. Patient did have diminished breath sounds initially, was given a breathing treatment. She reports that she had significant symptomatic improvement with the breathing treatment and repeat examination revealed increased air movement. We'll continue bronchodilator therapy, prednisone, Zithromax.  Final Clinical Impressions(s) / ED Diagnoses   Final diagnoses:  Acute bronchitis, unspecified organism    New Prescriptions New Prescriptions   No medications on file     Gilda Crease, MD 05/15/16 [phone]

## 2016-05-15 NOTE — ED Triage Notes (Signed)
Pt states that she has had a cough for the past two days, c/o chest soreness and abd pain. Pt states she has been coughing so much it makes her gag, nonproductive.

## 2016-07-27 ENCOUNTER — Encounter (HOSPITAL_COMMUNITY): Payer: Self-pay | Admitting: *Deleted

## 2016-07-27 ENCOUNTER — Inpatient Hospital Stay (HOSPITAL_COMMUNITY)
Admission: AD | Admit: 2016-07-27 | Discharge: 2016-07-27 | Disposition: A | Discharge status: Home / Self Care | Payer: Medicaid Other | Source: Ambulatory Visit | Attending: Obstetrics and Gynecology | Admitting: Obstetrics and Gynecology

## 2016-07-27 DIAGNOSIS — Z841 Family history of disorders of kidney and ureter: Secondary | ICD-10-CM | POA: Insufficient documentation

## 2016-07-27 DIAGNOSIS — F329 Major depressive disorder, single episode, unspecified: Secondary | ICD-10-CM | POA: Insufficient documentation

## 2016-07-27 DIAGNOSIS — Z711 Person with feared health complaint in whom no diagnosis is made: Secondary | ICD-10-CM

## 2016-07-27 DIAGNOSIS — Z825 Family history of asthma and other chronic lower respiratory diseases: Secondary | ICD-10-CM | POA: Insufficient documentation

## 2016-07-27 DIAGNOSIS — Z808 Family history of malignant neoplasm of other organs or systems: Secondary | ICD-10-CM | POA: Insufficient documentation

## 2016-07-27 DIAGNOSIS — Z8619 Personal history of other infectious and parasitic diseases: Secondary | ICD-10-CM | POA: Insufficient documentation

## 2016-07-27 DIAGNOSIS — Z833 Family history of diabetes mellitus: Secondary | ICD-10-CM | POA: Insufficient documentation

## 2016-07-27 DIAGNOSIS — F17210 Nicotine dependence, cigarettes, uncomplicated: Secondary | ICD-10-CM | POA: Insufficient documentation

## 2016-07-27 DIAGNOSIS — N760 Acute vaginitis: Secondary | ICD-10-CM

## 2016-07-27 DIAGNOSIS — Z8249 Family history of ischemic heart disease and other diseases of the circulatory system: Secondary | ICD-10-CM | POA: Insufficient documentation

## 2016-07-27 DIAGNOSIS — B9689 Other specified bacterial agents as the cause of diseases classified elsewhere: Secondary | ICD-10-CM

## 2016-07-27 DIAGNOSIS — Z809 Family history of malignant neoplasm, unspecified: Secondary | ICD-10-CM | POA: Insufficient documentation

## 2016-07-27 DIAGNOSIS — Z880 Allergy status to penicillin: Secondary | ICD-10-CM | POA: Insufficient documentation

## 2016-07-27 DIAGNOSIS — Z87440 Personal history of urinary (tract) infections: Secondary | ICD-10-CM | POA: Insufficient documentation

## 2016-07-27 DIAGNOSIS — Z3202 Encounter for pregnancy test, result negative: Secondary | ICD-10-CM | POA: Insufficient documentation

## 2016-07-27 LAB — WET PREP, GENITAL
Sperm: NONE SEEN
TRICH WET PREP: NONE SEEN
YEAST WET PREP: NONE SEEN

## 2016-07-27 LAB — POCT PREGNANCY, URINE: PREG TEST UR: NEGATIVE

## 2016-07-27 LAB — URINALYSIS, ROUTINE W REFLEX MICROSCOPIC
Bilirubin Urine: NEGATIVE
Glucose, UA: NEGATIVE mg/dL
KETONES UR: NEGATIVE mg/dL
Leukocytes, UA: NEGATIVE
Nitrite: NEGATIVE
PH: 6 (ref 5.0–8.0)
PROTEIN: NEGATIVE mg/dL
Specific Gravity, Urine: 1.027 (ref 1.005–1.030)

## 2016-07-27 LAB — RPR: RPR Ser Ql: NONREACTIVE

## 2016-07-27 LAB — HIV ANTIBODY (ROUTINE TESTING W REFLEX): HIV Screen 4th Generation wRfx: NONREACTIVE

## 2016-07-27 MED ORDER — METRONIDAZOLE 500 MG PO TABS
500.0000 mg | ORAL_TABLET | Freq: Two times a day (BID) | ORAL | 0 refills | Status: DC
Start: 2016-07-27 — End: 2016-09-20

## 2016-07-27 NOTE — MAU Provider Note (Signed)
History   [id number]   Chief Complaint  Patient presents with  . Vaginal Discharge    HPI Natalie Chavez is a 20 y.o. female  G2P1001 here with report of vaginal discharge x one week.  Discharge is described as watery and with an odor.  +vaginal irritation.  Denies pelvic pain or cramping.  No report of vaginal lesions.  Desires STI screening.      Also requests referral to Behavior Health services.  Father recently died and [name] was shot and is permanently blind.  Desires practitioner to talk to .    Patient's last menstrual period was 07/15/2016.  OB History  Gravida Para Term Preterm AB Living  2 1 1     1   SAB TAB Ectopic Multiple Live Births          1    # Outcome Date GA Lbr Len/2nd Weight Sex Delivery Anes PTL Lv  2 Gravida           1 Term 06/21/13 [id number]  7 lb 9.7 oz (3.45 kg) M CS-LTranv EPI  LIV      Past Medical History:  Diagnosis Date  . Depression    post partum depression for two weeks  . Infection    UTI  . Medical history non-contributory   . Pre-eclampsia   . Pregnancy induced hypertension   . Urinary tract infection     Family History  Problem Relation Age of Onset  . Diabetes Mother   . Hyperlipidemia Mother   . Asthma Mother   . Hypertension Mother   . Hyperlipidemia Father   . Hypertension Father   . Cancer Maternal Grandmother   . Kidney disease Maternal Grandmother   . Cancer Maternal Aunt     Rectal  . Hypertension Maternal Aunt     Social History   Social History  . Marital status: Single    Spouse name: N/A  . Number of children: N/A  . Years of education: N/A   Social History Main Topics  . Smoking status: Current Every Day Smoker    Packs/day: 0.25    Years: 1.00    Types: Cigarettes  . Smokeless tobacco: Never Used  . Alcohol use Yes     Comment: Occasional  . Drug use: No  . Sexual activity: Yes    Birth control/ protection: None, Condom     Comment: last sex, 16 Jun 16    Other Topics Concern  . None    Social History Narrative  . None    Allergies  Allergen Reactions  . Penicillins Hives and Other (See Comments)    Has patient had a PCN reaction causing immediate rash, facial/tongue/throat swelling, SOB or lightheadedness with hypotension: No Has patient had a PCN reaction causing severe rash involving mucus membranes or skin necrosis: No Has patient had a PCN reaction that required hospitalization No Has patient had a PCN reaction occurring within the last 10 years: No If all of the above answers are "NO", then may proceed with Cephalosporin use.    No current facility-administered medications on file prior to encounter.    Current Outpatient Prescriptions on File Prior to Encounter  Medication Sig Dispense Refill  . azithromycin (ZITHROMAX) 250 MG tablet Take 1 tablet (250 mg total) by mouth daily. 4 tablet 0  . HYDROcodone-homatropine (HYCODAN) 5-1.5 MG/5ML syrup Take 5 mLs by mouth every 6 (six) hours as needed for cough. 75 mL 0  . ibuprofen (ADVIL,MOTRIN) 200 MG tablet Take  200 mg by mouth every 6 (six) hours as needed for mild pain.    . metroNIDAZOLE (METROGEL) 0.75 % vaginal gel Place 1 Applicatorful vaginally at bedtime. For 5 days (Patient not taking: Reported on 05/15/2016) 70 g 0  . predniSONE (DELTASONE) 20 MG tablet Take 2 tablets (40 mg total) by mouth daily with breakfast. 10 tablet 0     Review of Systems  Genitourinary: Positive for dysuria, vaginal discharge and vaginal pain. Negative for frequency, hematuria, pelvic pain and vaginal bleeding.  Psychiatric/Behavioral: Negative for self-injury and suicidal ideas.  All other systems reviewed and are negative.    Physical Exam   Vitals:   07/27/16 0229  BP: 132/88  Pulse: 102  Resp: 17  Temp: 98.4 F (36.9 C)  TempSrc: Oral  SpO2: 99%  Weight: 223 lb (101.2 kg)  Height: 5\' 5"  (1.651 m)    Physical Exam  Constitutional: She is oriented to person, place, and time. She appears well-developed and  well-nourished.  HENT:  Head: Normocephalic.  Eyes: Pupils are equal, round, and reactive to light.  Neck: Normal range of motion. Neck supple.  Cardiovascular: Normal rate, regular rhythm and normal heart sounds.   Respiratory: Effort normal and breath sounds normal.  GI: Soft. She exhibits no mass. There is no tenderness. There is no guarding.  Genitourinary: There is no rash or lesion on the right labia. There is no rash or lesion on the left labia. No erythema or bleeding in the vagina. Vaginal discharge (white, watery think discharge; +odor) found.  Genitourinary Comments: Negative cervical motion tenderness  Neurological: She is alert and oriented to person, place, and time. She has normal reflexes.  Skin: Skin is warm and dry.    MAU Course  Procedures  MDM Results for orders placed or performed during the hospital encounter of 07/27/16 (from the past 24 hour(s))  Urinalysis, Routine w reflex microscopic     Status: Abnormal   Collection Time: 07/27/16  2:48 AM  Result Value Ref Range   Color, Urine YELLOW YELLOW   APPearance HAZY (A) CLEAR   Specific Gravity, Urine 1.027 1.005 - 1.030   pH 6.0 5.0 - 8.0   Glucose, UA NEGATIVE NEGATIVE mg/dL   Hgb urine dipstick SMALL (A) NEGATIVE   Bilirubin Urine NEGATIVE NEGATIVE   Ketones, ur NEGATIVE NEGATIVE mg/dL   Protein, ur NEGATIVE NEGATIVE mg/dL   Nitrite NEGATIVE NEGATIVE   Leukocytes, UA NEGATIVE NEGATIVE   RBC / HPF 0-5 0 - 5 RBC/hpf   WBC, UA 6-30 0 - 5 WBC/hpf   Bacteria, UA FEW (A) NONE SEEN   Squamous Epithelial / LPF 0-5 (A) NONE SEEN   Mucous PRESENT   Pregnancy, urine POC     Status: None   Collection Time: 07/27/16  2:50 AM  Result Value Ref Range   Preg Test, Ur NEGATIVE NEGATIVE  Wet prep, genital     Status: Abnormal   Collection Time: 07/27/16  3:08 AM  Result Value Ref Range   Yeast Wet Prep HPF POC NONE SEEN NONE SEEN   Trich, Wet Prep NONE SEEN NONE SEEN   Clue Cells Wet Prep HPF POC PRESENT (A) NONE  SEEN   WBC, Wet Prep HPF POC MODERATE (A) NONE SEEN   Sperm NONE SEEN      Assessment and Plan  Bacterial Vaginosis STI Screening  Plan: Discharge home RX Flagyl 500 mg BID x 7 days GC/CT pending HIV/RPR pending Urine culture sent Given information regarding contraception choices  Eino FarberWalidah Kennith GainN Karim, CNM 07/27/2016 3:12 AM

## 2016-07-27 NOTE — MAU Note (Signed)
Pt reports burning with urination , worsening tonight. Also reports a lot of vaginal discharge.

## 2016-07-27 NOTE — Discharge Instructions (Signed)
Bacterial Vaginosis Bacterial vaginosis is an infection of the vagina. It happens when too many germs (bacteria) grow in the vagina. This infection puts you at risk for infections from sex (STIs). Treating this infection can lower your risk for some STIs. You should also treat this if you are pregnant. It can cause your baby to be born early. Follow these instructions at home: Medicines  Take over-the-counter and prescription medicines only as told by your doctor.  Take or use your antibiotic medicine as told by your doctor. Do not stop taking or using it even if you start to feel better. General instructions  If you your sexual partner is a woman, tell her that you have this infection. She needs to get treatment if she has symptoms. If you have a female partner, he does not need to be treated.  During treatment:  Avoid sex.  Do not douche.  Avoid alcohol as told.  Avoid breastfeeding as told.  Drink enough fluid to keep your pee (urine) clear or pale yellow.  Keep your vagina and butt (rectum) clean.  Wash the area with warm water every day.  Wipe from front to back after you use the toilet.  Keep all follow-up visits as told by your doctor. This is important. Preventing this condition  Do not douche.  Use only warm water to wash around your vagina.  Use protection when you have sex. This includes:  Latex condoms.  Dental dams.  Limit how many people you have sex with. It is best to only have sex with the same person (be monogamous).  Get tested for STIs. Have your partner get tested.  Wear underwear that is cotton or lined with cotton.  Avoid tight pants and pantyhose. This is most important in summer.  Do not use any products that have nicotine or tobacco in them. These include cigarettes and e-cigarettes. If you need help quitting, ask your doctor.  Do not use illegal drugs.  Limit how much alcohol you drink. Contact a doctor if:  Your symptoms do not  get better, even after you are treated.  You have more discharge or pain when you pee (urinate).  You have a fever.  You have pain in your belly (abdomen).  You have pain with sex.  Your bleed from your vagina between periods. Summary  This infection happens when too many germs (bacteria) grow in the vagina.  Treating this condition can lower your risk for some infections from sex (STIs).  You should also treat this if you are pregnant. It can cause early (premature) birth.  Do not stop taking or using your antibiotic medicine even if you start to feel better. This information is not intended to replace advice given to you by your health care provider. Make sure you discuss any questions you have with your health care provider. Document Released: 05/05/2008 Document Revised: 04/11/2016 Document Reviewed: 04/11/2016 Elsevier Interactive Patient Education  2017 [employer]   Behavioral Health Resources:   What if I or someone I know is in crisis?   If you are thinking about harming yourself or having thoughts of suicide, or if you know someone who is, seek help right away.   Call your doctor or mental health care provider.   Call 911 or go to a hospital emergency room to get immediate help, or ask a friend or family member to help you do these things.   Call the [place] National Suicide Prevention Lifelines toll-free, 24-hour hotline at 1-800-273-TALK (  434-466-07711-864-698-1104) or TTY: 9-811-914-7: 1-800-799-4 TTY 626-585-9421(1-973-378-7204) to talk to a trained counselor.   If you are in crisis, make sure you are not left alone.    If someone else is in crisis, make sure he or she is not left alone   24 Hour Availability  [facility]  [address], [place], [place] [address]  [phone] or [phone]  [facility] (Domestic Violence, Rape & Victim Assistance (438)[phone]  [facility]  [address]  [place], [place]  [address]               [phone] or [phone]  [facility] [place] [facility]    (ONLY from 8am-4pm)    [phone]  [facility] (24/7)   [phone]  [place] National Suicide Hotline   872-147-89731-864-698-1104 Len Childs(TALK)  Support from local police to aid getting patient to hospital (http://www.[place].gov/index.aspx?page=2797)         ONGOING BEHAVIORAL HEALTH SUPPORT FOR UNINSURED and UNDERINSURED:  [facility]  [phone]   [address]  Walk-in first time, Monday-Friday, 8:30am-5:00pm  *Bring snack, drink, something to do, long wait at first visit, they do have pharmacy for behavioral health medications/ Bring own interpreter at first visit, if needed  [facility]  [phone]  [address]  Walk-in Monday-Friday, 8:30am-12pm & 1-2:30pm  *pacientes que hablen espanol, favor comunicarse con el Sr. [place], extension 2244 o la [address], extension [phone] para hacer Marian Sorrowuna cita   [facility]:  [phone] or kellinfoundation@gmail .com  [address], [address]  Call or email, may self-refer  * uninsured/underinsured, [phone], have both mental health and substance use challenges   [facility]:  Phone [phone](336) [phone]; Fax [phone]  *Call to schedule an appointment  3rd Floor located @?[address]  Mon-Thursday: 8:30am-8:00pm Friday: 8:30am-7:00pm  * Be sure to park in a space labeled Psychology Department, located to the right of the main door of the building. Enter the main doors facing the parking lot and take the elevator or stairs to the 3rd Floor.   [facility]:  [phone] or  [phone] (24/hour helpline)  [address]  Call to make appointment, tends to be a long wait to begin services, depending on insurance    [facility]  [phone](336) [phone] ??  *Call to schedule an appointment   [address]  Monday-Friday, 8:00am-5:00pm            [facility]  [phone]  [address], [place]  Monday-Friday, walk-in 8am-3pm  First appointment is assessment, then will make appointment for psychiatry

## 2016-07-28 LAB — GC/CHLAMYDIA PROBE AMP (~~LOC~~) NOT AT ARMC
CHLAMYDIA, DNA PROBE: NEGATIVE
Neisseria Gonorrhea: NEGATIVE

## 2016-07-28 LAB — URINE CULTURE

## 2016-09-20 ENCOUNTER — Inpatient Hospital Stay (HOSPITAL_COMMUNITY)
Admission: AD | Admit: 2016-09-20 | Discharge: 2016-09-20 | Disposition: A | Discharge status: Home / Self Care | Payer: Medicaid Other | Source: Ambulatory Visit | Attending: Obstetrics and Gynecology | Admitting: Obstetrics and Gynecology

## 2016-09-20 ENCOUNTER — Encounter (HOSPITAL_COMMUNITY): Payer: Self-pay | Admitting: *Deleted

## 2016-09-20 DIAGNOSIS — N760 Acute vaginitis: Secondary | ICD-10-CM

## 2016-09-20 DIAGNOSIS — B9689 Other specified bacterial agents as the cause of diseases classified elsewhere: Secondary | ICD-10-CM | POA: Insufficient documentation

## 2016-09-20 DIAGNOSIS — F17210 Nicotine dependence, cigarettes, uncomplicated: Secondary | ICD-10-CM | POA: Insufficient documentation

## 2016-09-20 DIAGNOSIS — Z880 Allergy status to penicillin: Secondary | ICD-10-CM | POA: Insufficient documentation

## 2016-09-20 DIAGNOSIS — Z3202 Encounter for pregnancy test, result negative: Secondary | ICD-10-CM | POA: Insufficient documentation

## 2016-09-20 LAB — URINALYSIS, ROUTINE W REFLEX MICROSCOPIC
BILIRUBIN URINE: NEGATIVE
GLUCOSE, UA: NEGATIVE mg/dL
HGB URINE DIPSTICK: NEGATIVE
Ketones, ur: NEGATIVE mg/dL
Leukocytes, UA: NEGATIVE
Nitrite: NEGATIVE
PH: 7 (ref 5.0–8.0)
Protein, ur: NEGATIVE mg/dL
SPECIFIC GRAVITY, URINE: 1.019 (ref 1.005–1.030)

## 2016-09-20 LAB — WET PREP, GENITAL
SPERM: NONE SEEN
Trich, Wet Prep: NONE SEEN
Yeast Wet Prep HPF POC: NONE SEEN

## 2016-09-20 LAB — POCT PREGNANCY, URINE: PREG TEST UR: NEGATIVE

## 2016-09-20 MED ORDER — METRONIDAZOLE 500 MG PO TABS: 500.0000 mg | ORAL_TABLET | Freq: Two times a day (BID) | ORAL | 0 refills | Status: DC

## 2016-09-20 NOTE — MAU Note (Signed)
Pt reports she has had a vaginal discharge and wants to check to see if she has an STD.

## 2016-09-20 NOTE — Discharge Instructions (Signed)
Bacterial Vaginosis Bacterial vaginosis is a vaginal infection that occurs when the normal balance of bacteria in the vagina is disrupted. It results from an overgrowth of certain bacteria. This is the most common vaginal infection among women ages 15-44. Because bacterial vaginosis increases your risk for STIs (sexually transmitted infections), getting treated can help reduce your risk for chlamydia, gonorrhea, herpes, and HIV (human immunodeficiency virus). Treatment is also important for preventing complications in pregnant women, because this condition can cause an early (premature) delivery. What are the causes? This condition is caused by an increase in harmful bacteria that are normally present in small amounts in the vagina. However, the reason that the condition develops is not fully understood. What increases the risk? The following factors may make you more likely to develop this condition:  Having a new sexual partner or multiple sexual partners.  Having unprotected sex.  Douching.  Having an intrauterine device (IUD).  Smoking.  Drug and alcohol abuse.  Taking certain antibiotic medicines.  Being pregnant.  You cannot get bacterial vaginosis from toilet seats, bedding, swimming pools, or contact with objects around you. What are the signs or symptoms? Symptoms of this condition include:  Grey or white vaginal discharge. The discharge can also be watery or foamy.  A fish-like odor with discharge, especially after sexual intercourse or during menstruation.  Itching in and around the vagina.  Burning or pain with urination.  Some women with bacterial vaginosis have no signs or symptoms. How is this diagnosed? This condition is diagnosed based on:  Your medical history.  A physical exam of the vagina.  Testing a sample of vaginal fluid under a microscope to look for a large amount of bad bacteria or abnormal cells. Your health care provider may use a cotton swab  or a small wooden spatula to collect the sample.  How is this treated? This condition is treated with antibiotics. These may be given as a pill, a vaginal cream, or a medicine that is put into the vagina (suppository). If the condition comes back after treatment, a second round of antibiotics may be needed. Follow these instructions at home: Medicines  Take over-the-counter and prescription medicines only as told by your health care provider.  Take or use your antibiotic as told by your health care provider. Do not stop taking or using the antibiotic even if you start to feel better. General instructions  If you have a female sexual partner, tell her that you have a vaginal infection. She should see her health care provider and be treated if she has symptoms. If you have a female sexual partner, he does not need treatment.  During treatment: ? Avoid sexual activity until you finish treatment. ? Do not douche. ? Avoid alcohol as directed by your health care provider. ? Avoid breastfeeding as directed by your health care provider.  Drink enough water and fluids to keep your urine clear or pale yellow.  Keep the area around your vagina and rectum clean. ? Wash the area daily with warm water. ? Wipe yourself from front to back after using the toilet.  Keep all follow-up visits as told by your health care provider. This is important. How is this prevented?  Do not douche.  Wash the outside of your vagina with warm water only.  Use protection when having sex. This includes latex condoms and dental dams.  Limit how many sexual partners you have. To help prevent bacterial vaginosis, it is best to have sex with just   one partner (monogamous).  Make sure you and your sexual partner are tested for STIs.  Wear cotton or cotton-lined underwear.  Avoid wearing tight pants and pantyhose, especially during summer.  Limit the amount of alcohol that you drink.  Do not use any products that  contain nicotine or tobacco, such as cigarettes and e-cigarettes. If you need help quitting, ask your health care provider.  Do not use illegal drugs. Where to find more information:  Centers for Disease Control and Prevention: www.cdc.gov/std  American Sexual Health Association (ASHA): www.ashastd.org  U.S. Department of Health and Human Services, Office on Women's Health: www.womenshealth.gov/ or https://www.womenshealth.gov/a-z-topics/bacterial-vaginosis Contact a health care provider if:  Your symptoms do not improve, even after treatment.  You have more discharge or pain when urinating.  You have a fever.  You have pain in your abdomen.  You have pain during sex.  You have vaginal bleeding between periods. Summary  Bacterial vaginosis is a vaginal infection that occurs when the normal balance of bacteria in the vagina is disrupted.  Because bacterial vaginosis increases your risk for STIs (sexually transmitted infections), getting treated can help reduce your risk for chlamydia, gonorrhea, herpes, and HIV (human immunodeficiency virus). Treatment is also important for preventing complications in pregnant women, because the condition can cause an early (premature) delivery.  This condition is treated with antibiotic medicines. These may be given as a pill, a vaginal cream, or a medicine that is put into the vagina (suppository). This information is not intended to replace advice given to you by your health care provider. Make sure you discuss any questions you have with your health care provider. Document Released: 07/27/2005 Document Revised: 04/11/2016 Document Reviewed: 04/11/2016 Elsevier Interactive Patient Education  2017 Elsevier Inc.  

## 2016-09-20 NOTE — MAU Provider Note (Signed)
Chief Complaint: No chief complaint on file.   First Provider Initiated Contact with Patient 09/20/16 2319      SUBJECTIVE HPI: Natalie Chavez is a 21 y.o. G2P1001 who presents to maternity admissions reporting clear vaginal discharge with slight odor and concerns about STD. She reports the discharge is new, started yesterday, and is constant and unchanged since onset. It has no associated symptoms.  She denies known exposure but wants to be tested for STDs. She denies vaginal bleeding, vaginal itching/burning, urinary symptoms, h/a, dizziness, n/v, or fever/chills.     HPI  Past Medical History:  Diagnosis Date  . Depression    post partum depression for two weeks  . Infection    UTI  . Medical history non-contributory   . Pre-eclampsia   . Pregnancy induced hypertension   . Urinary tract infection    Past Surgical History:  Procedure Laterality Date  . CESAREAN SECTION N/A 06/21/2013   Procedure: CESAREAN SECTION;  Surgeon: Purcell Nails, MD;  Location: [facility];  Service: Obstetrics;  Laterality: N/A;  . HERNIA REPAIR     Social History   Social History  . Marital status: Single    Spouse name: N/A  . Number of children: N/A  . Years of education: N/A   Occupational History  . Not on file.   Social History Main Topics  . Smoking status: Current Every Day Smoker    Packs/day: 0.25    Years: 1.00    Types: Cigarettes  . Smokeless tobacco: Never Used  . Alcohol use Yes     Comment: Occasional  . Drug use: No  . Sexual activity: Yes    Birth control/ protection: None, Condom     Comment: last sex, 16 Jun 16    Other Topics Concern  . Not on file   Social History Narrative  . No narrative on file   No current facility-administered medications on file prior to encounter.    No current outpatient prescriptions on file prior to encounter.   Allergies  Allergen Reactions  . Penicillins Hives and Other (See Comments)    Has patient had a PCN reaction  causing immediate rash, facial/tongue/throat swelling, SOB or lightheadedness with hypotension: No Has patient had a PCN reaction causing severe rash involving mucus membranes or skin necrosis: No Has patient had a PCN reaction that required hospitalization No Has patient had a PCN reaction occurring within the last 10 years: No If all of the above answers are "NO", then may proceed with Cephalosporin use.    ROS:  Review of Systems  Constitutional: Negative for chills, fatigue and fever.  Respiratory: Negative for shortness of breath.   Cardiovascular: Negative for chest pain.  Genitourinary: Positive for vaginal discharge. Negative for difficulty urinating, dysuria, flank pain, pelvic pain, vaginal bleeding and vaginal pain.  Neurological: Negative for dizziness and headaches.  Psychiatric/Behavioral: Negative.      I have reviewed patient's Past Medical Hx, Surgical Hx, Family Hx, Social Hx, medications and allergies.   Physical Exam  Patient Vitals for the past 24 hrs:  BP Temp Temp src Pulse Resp SpO2  09/20/16 2329 123/82 - - 114 17 -  09/20/16 2143 133/77 98 F (36.7 C) Oral 68 16 100 %   Constitutional: Well-developed, well-nourished female in no acute distress.  Cardiovascular: normal rate Respiratory: normal effort GI: Abd soft, non-tender. Pos BS x 4 MS: Extremities nontender, no edema, normal ROM Neurologic: Alert and oriented x 4.  GU: Neg CVAT.  PELVIC EXAM: Wet prep and GCC collected by blind swab   LAB RESULTS Results for orders placed or performed during the hospital encounter of 09/20/16 (from the past 24 hour(s))  Urinalysis, Routine w reflex microscopic     Status: None   Collection Time: 09/20/16  9:30 PM  Result Value Ref Range   Color, Urine YELLOW YELLOW   APPearance CLEAR CLEAR   Specific Gravity, Urine 1.019 1.005 - 1.030   pH 7.0 5.0 - 8.0   Glucose, UA NEGATIVE NEGATIVE mg/dL   Hgb urine dipstick NEGATIVE NEGATIVE   Bilirubin Urine NEGATIVE  NEGATIVE   Ketones, ur NEGATIVE NEGATIVE mg/dL   Protein, ur NEGATIVE NEGATIVE mg/dL   Nitrite NEGATIVE NEGATIVE   Leukocytes, UA NEGATIVE NEGATIVE  Pregnancy, urine POC     Status: None   Collection Time: 09/20/16  9:39 PM  Result Value Ref Range   Preg Test, Ur NEGATIVE NEGATIVE  Wet prep, genital     Status: Abnormal   Collection Time: 09/20/16 10:29 PM  Result Value Ref Range   Yeast Wet Prep HPF POC NONE SEEN NONE SEEN   Trich, Wet Prep NONE SEEN NONE SEEN   Clue Cells Wet Prep HPF POC PRESENT (A) NONE SEEN   WBC, Wet Prep HPF POC MODERATE (A) NONE SEEN   Sperm NONE SEEN        IMAGING No results found.  MAU Management/MDM: Ordered labs and reviewed results.  Consult Dr Su Hiltoberts.  Will treat for BV with Flagyl.  Other testing is pending.  Pt stable at time of discharge.  ASSESSMENT 1. BV (bacterial vaginosis)     PLAN Discharge home  Allergies as of 09/20/2016      Reactions   Penicillins Hives, Other (See Comments)   Has patient had a PCN reaction causing immediate rash, facial/tongue/throat swelling, SOB or lightheadedness with hypotension: No Has patient had a PCN reaction causing severe rash involving mucus membranes or skin necrosis: No Has patient had a PCN reaction that required hospitalization No Has patient had a PCN reaction occurring within the last 10 years: No If all of the above answers are "NO", then may proceed with Cephalosporin use.      Medication List    TAKE these medications   metroNIDAZOLE 500 MG tablet Commonly known as:  FLAGYL Take 1 tablet (500 mg total) by mouth 2 (two) times daily. What changed:  when to take this      Follow-up Information    Purcell NailsOBERTS,ANGELA Y, MD Follow up.   Specialty:  Obstetrics and Gynecology Why:  For routine Gyn care, return to MAU as needed for emergencies. Contact information: [address] [place] [id number] [phone]           Sharen CounterLisa Leftwich-Kirby Certified  Nurse-Midwife 09/20/2016  11:33 PM

## 2016-09-21 LAB — RPR: RPR Ser Ql: NONREACTIVE

## 2016-09-21 LAB — GC/CHLAMYDIA PROBE AMP (~~LOC~~) NOT AT ARMC
Chlamydia: NEGATIVE
Neisseria Gonorrhea: NEGATIVE

## 2016-09-21 LAB — HIV ANTIBODY (ROUTINE TESTING W REFLEX): HIV Screen 4th Generation wRfx: NONREACTIVE

## 2016-10-13 NOTE — Telephone Encounter (Signed)
See "Contacts" 

## 2016-11-09 ENCOUNTER — Inpatient Hospital Stay (HOSPITAL_COMMUNITY)
Admission: AD | Admit: 2016-11-09 | Discharge: 2016-11-09 | Discharge status: Against Medical Advice | Payer: Medicaid Other | Source: Ambulatory Visit | Attending: Obstetrics & Gynecology | Admitting: Obstetrics & Gynecology

## 2016-11-09 ENCOUNTER — Encounter (HOSPITAL_COMMUNITY): Payer: Self-pay

## 2016-11-09 DIAGNOSIS — N912 Amenorrhea, unspecified: Secondary | ICD-10-CM | POA: Insufficient documentation

## 2016-11-09 DIAGNOSIS — Z3202 Encounter for pregnancy test, result negative: Secondary | ICD-10-CM | POA: Insufficient documentation

## 2016-11-09 LAB — URINALYSIS, ROUTINE W REFLEX MICROSCOPIC
BILIRUBIN URINE: NEGATIVE
Bacteria, UA: NONE SEEN
GLUCOSE, UA: NEGATIVE mg/dL
Ketones, ur: NEGATIVE mg/dL
LEUKOCYTES UA: NEGATIVE
NITRITE: NEGATIVE
PH: 6 (ref 5.0–8.0)
Protein, ur: 30 mg/dL — AB
SPECIFIC GRAVITY, URINE: 1.031 — AB (ref 1.005–1.030)

## 2016-11-09 LAB — POCT PREGNANCY, URINE: Preg Test, Ur: NEGATIVE

## 2016-11-09 LAB — HCG, QUANTITATIVE, PREGNANCY: hCG, Beta Chain, Quant, S: <1 m[IU]/mL (ref <5)

## 2016-11-09 NOTE — MAU Note (Signed)
Pt had one +HPT yesterday; negative pregnancy test today. Denies any bleeding or pain. LMP 10/06/16

## 2016-12-04 ENCOUNTER — Inpatient Hospital Stay (HOSPITAL_COMMUNITY): Payer: Self-pay

## 2016-12-04 ENCOUNTER — Inpatient Hospital Stay (HOSPITAL_COMMUNITY)
Admission: AD | Admit: 2016-12-04 | Discharge: 2016-12-04 | Disposition: A | Discharge status: Home / Self Care | Payer: Self-pay | Source: Ambulatory Visit | Attending: Obstetrics and Gynecology | Admitting: Obstetrics and Gynecology

## 2016-12-04 ENCOUNTER — Encounter (HOSPITAL_COMMUNITY): Payer: Self-pay

## 2016-12-04 DIAGNOSIS — O3680X0 Pregnancy with inconclusive fetal viability, not applicable or unspecified: Secondary | ICD-10-CM

## 2016-12-04 DIAGNOSIS — Z8249 Family history of ischemic heart disease and other diseases of the circulatory system: Secondary | ICD-10-CM | POA: Insufficient documentation

## 2016-12-04 DIAGNOSIS — Z841 Family history of disorders of kidney and ureter: Secondary | ICD-10-CM | POA: Insufficient documentation

## 2016-12-04 DIAGNOSIS — F17210 Nicotine dependence, cigarettes, uncomplicated: Secondary | ICD-10-CM | POA: Insufficient documentation

## 2016-12-04 DIAGNOSIS — R109 Unspecified abdominal pain: Secondary | ICD-10-CM | POA: Insufficient documentation

## 2016-12-04 DIAGNOSIS — Z825 Family history of asthma and other chronic lower respiratory diseases: Secondary | ICD-10-CM | POA: Insufficient documentation

## 2016-12-04 DIAGNOSIS — Z880 Allergy status to penicillin: Secondary | ICD-10-CM | POA: Insufficient documentation

## 2016-12-04 DIAGNOSIS — O99332 Smoking (tobacco) complicating pregnancy, second trimester: Secondary | ICD-10-CM | POA: Insufficient documentation

## 2016-12-04 DIAGNOSIS — O26892 Other specified pregnancy related conditions, second trimester: Secondary | ICD-10-CM | POA: Insufficient documentation

## 2016-12-04 DIAGNOSIS — O219 Vomiting of pregnancy, unspecified: Secondary | ICD-10-CM

## 2016-12-04 DIAGNOSIS — Z800 Family history of malignant neoplasm of digestive organs: Secondary | ICD-10-CM | POA: Insufficient documentation

## 2016-12-04 DIAGNOSIS — Z809 Family history of malignant neoplasm, unspecified: Secondary | ICD-10-CM | POA: Insufficient documentation

## 2016-12-04 DIAGNOSIS — Z3A00 Weeks of gestation of pregnancy not specified: Secondary | ICD-10-CM | POA: Insufficient documentation

## 2016-12-04 DIAGNOSIS — Z9889 Other specified postprocedural states: Secondary | ICD-10-CM | POA: Insufficient documentation

## 2016-12-04 DIAGNOSIS — Z833 Family history of diabetes mellitus: Secondary | ICD-10-CM | POA: Insufficient documentation

## 2016-12-04 DIAGNOSIS — Z3201 Encounter for pregnancy test, result positive: Secondary | ICD-10-CM | POA: Insufficient documentation

## 2016-12-04 DIAGNOSIS — Z87440 Personal history of urinary (tract) infections: Secondary | ICD-10-CM | POA: Insufficient documentation

## 2016-12-04 DIAGNOSIS — O26899 Other specified pregnancy related conditions, unspecified trimester: Secondary | ICD-10-CM

## 2016-12-04 LAB — CBC
HEMATOCRIT: 37.4 % (ref 36.0–46.0)
Hemoglobin: 12.6 g/dL (ref 12.0–15.0)
MCH: 29.8 pg (ref 26.0–34.0)
MCHC: 33.7 g/dL (ref 30.0–36.0)
MCV: 88.4 fL (ref 78.0–100.0)
PLATELETS: 195 10*3/uL (ref 150–400)
RBC: 4.23 MIL/uL (ref 3.87–5.11)
RDW: 13.5 % (ref 11.5–15.5)
WBC: 11.5 10*3/uL — AB (ref 4.0–10.5)

## 2016-12-04 LAB — WET PREP, GENITAL
Clue Cells Wet Prep HPF POC: NONE SEEN
Sperm: NONE SEEN
Trich, Wet Prep: NONE SEEN
YEAST WET PREP: NONE SEEN

## 2016-12-04 LAB — URINALYSIS, ROUTINE W REFLEX MICROSCOPIC
BACTERIA UA: NONE SEEN
BILIRUBIN URINE: NEGATIVE
GLUCOSE, UA: NEGATIVE mg/dL
KETONES UR: NEGATIVE mg/dL
Leukocytes, UA: NEGATIVE
NITRITE: NEGATIVE
PROTEIN: 30 mg/dL — AB
Specific Gravity, Urine: 1.028 (ref 1.005–1.030)
pH: 5 (ref 5.0–8.0)

## 2016-12-04 LAB — HCG, QUANTITATIVE, PREGNANCY: HCG, BETA CHAIN, QUANT, S: 22 m[IU]/mL — AB (ref <5)

## 2016-12-04 LAB — POCT PREGNANCY, URINE: PREG TEST UR: POSITIVE — AB

## 2016-12-04 MED ORDER — PROMETHAZINE HCL 25 MG PO TABS: 25.0000 mg | ORAL_TABLET | Freq: Four times a day (QID) | ORAL | 0 refills | Status: DC | PRN

## 2016-12-04 NOTE — MAU Provider Note (Signed)
History     CSN: [id number]  Arrival date and time: 12/04/16 4696   First Provider Initiated Contact with Patient 12/04/16 [phone]      Chief Complaint  Patient presents with  . Abdominal Cramping  . Vaginal Bleeding  . Emesis   G2P1001 #[redacted]w[redacted]d by LMP here with lower abdominal cramping x3 days. Describes as midline, intermittent, cramping, rates pain 9/10. Has not used anything for the pain. Reports white vaginal discharge, no odor, some itching last week. Also reports vomiting x2 days. Cannot tolerate anything po. No new partner. No hx of STDs. Of note pt reports LMP was only 2 days of spotting.      OB History    Gravida Para Term Preterm AB Living   SAB TAB Ectopic Multiple Live Births           1      Past Medical History:  Diagnosis Date  . Depression    post partum depression for two weeks  . Infection    UTI  . Medical history non-contributory   . Pre-eclampsia   . Pregnancy induced hypertension   . Urinary tract infection     Past Surgical History:  Procedure Laterality Date  . CESAREAN SECTION N/A 06/21/2013   Procedure: CESAREAN SECTION;  Surgeon: Purcell Nails, MD;  Location: [facility];  Service: Obstetrics;  Laterality: N/A;  . HERNIA REPAIR      Family History  Problem Relation Age of Onset  . Diabetes Mother   . Hyperlipidemia Mother   . Asthma Mother   . Hypertension Mother   . Hyperlipidemia Father   . Hypertension Father   . Cancer Maternal Grandmother   . Kidney disease Maternal Grandmother   . Cancer Maternal Aunt     Rectal  . Hypertension Maternal Aunt     Social History  Substance Use Topics  . Smoking status: Current Every Day Smoker    Packs/day: 0.25    Years: 1.00    Types: Cigarettes  . Smokeless tobacco: Never Used  . Alcohol use Yes     Comment: Occasional    Allergies:  Allergies  Allergen Reactions  . Penicillins Hives and Other (See Comments)    Has patient had a PCN reaction causing immediate rash,  facial/tongue/throat swelling, SOB or lightheadedness with hypotension: No Has patient had a PCN reaction causing severe rash involving mucus membranes or skin necrosis: No Has patient had a PCN reaction that required hospitalization No Has patient had a PCN reaction occurring within the last 10 years: No If all of the above answers are "NO", then may proceed with Cephalosporin use.    Prescriptions Prior to Admission  Medication Sig Dispense Refill Last Dose  . metroNIDAZOLE (FLAGYL) 500 MG tablet Take 1 tablet (500 mg total) by mouth 2 (two) times daily. (Patient not taking: Reported on 12/04/2016) 14 tablet 0 Completed Course at Unknown time    Review of Systems  Constitutional: Negative for fever.  Gastrointestinal: Positive for abdominal pain, nausea and vomiting. Negative for constipation and diarrhea.  Genitourinary: Positive for frequency and vaginal discharge. Negative for difficulty urinating, dysuria, hematuria, urgency and vaginal bleeding.  Musculoskeletal: Positive for back pain.  Neurological: Positive for headaches.   Physical Exam   Blood pressure 132/77, pulse (!) 109, temperature 98.9 F (37.2 C), temperature source Oral, resp. rate 16, height  (1.651 m), weight 101.6 kg (224 lb 0.6 oz), last menstrual period  11/10/2016.  Physical Exam  Nursing note and vitals reviewed. Constitutional: She is oriented to person, place, and time. She appears well-developed and well-nourished. No distress (appears comfortable).  HENT:  Head: Normocephalic and atraumatic.  Neck: Normal range of motion.  Respiratory: Effort normal.  GI: Soft. She exhibits no distension and no mass. There is no tenderness. There is no rebound and no guarding.  Genitourinary:  Genitourinary Comments: External: no lesions or erythema Vagina: rugated, parous, thin white frothy discharge Uterus: non enlarged, anteverted, non tender, no CMT Adnexae: no masses, no tenderness left, no tenderness right    Musculoskeletal: Normal range of motion.       Cervical back: She exhibits no tenderness and no swelling.       Thoracic back: She exhibits no tenderness and no swelling.       Lumbar back: She exhibits tenderness. She exhibits no swelling.  Neurological: She is alert and oriented to person, place, and time.  Skin: Skin is warm and dry.  Psychiatric: She has a normal mood and affect.   Results for orders placed or performed during the hospital encounter of 12/04/16 (from the past 24 hour(s))  Urinalysis, Routine w reflex microscopic     Status: Abnormal   Collection Time: 12/04/16  8:50 AM  Result Value Ref Range   Color, Urine YELLOW YELLOW   APPearance CLOUDY (A) CLEAR   Specific Gravity, Urine 1.028 1.005 - 1.030   pH 5.0 5.0 - 8.0   Glucose, UA NEGATIVE NEGATIVE mg/dL   Hgb urine dipstick SMALL (A) NEGATIVE   Bilirubin Urine NEGATIVE NEGATIVE   Ketones, ur NEGATIVE NEGATIVE mg/dL   Protein, ur 30 (A) NEGATIVE mg/dL   Nitrite NEGATIVE NEGATIVE   Leukocytes, UA NEGATIVE NEGATIVE   RBC / HPF 6-30 0 - 5 RBC/hpf   WBC, UA 6-30 0 - 5 WBC/hpf   Bacteria, UA NONE SEEN NONE SEEN   Squamous Epithelial / LPF TOO NUMEROUS TO COUNT (A) NONE SEEN   Mucous PRESENT   Pregnancy, urine POC     Status: Abnormal   Collection Time: 12/04/16  9:02 AM  Result Value Ref Range   Preg Test, Ur POSITIVE (A) NEGATIVE  CBC     Status: Abnormal   Collection Time: 12/04/16  9:12 AM  Result Value Ref Range   WBC 11.5 (H) 4.0 - 10.5 K/uL   RBC 4.23 3.87 - 5.11 MIL/uL   Hemoglobin 12.6 12.0 - 15.0 g/dL   HCT 16.1 09.6 - 04.5 %   MCV 88.4 78.0 - 100.0 fL   MCH 29.8 26.0 - 34.0 pg   MCHC 33.7 30.0 - 36.0 g/dL   RDW 40.9 81.1 - 91.4 %   Platelets 195 150 - 400 K/uL  hCG, quantitative, pregnancy     Status: Abnormal   Collection Time: 12/04/16  9:12 AM  Result Value Ref Range   hCG, Beta Chain, Quant, S 22 (H) <5 mIU/mL  Wet prep, genital     Status: Abnormal   Collection Time: 12/04/16  9:25 AM   Result Value Ref Range   Yeast Wet Prep HPF POC NONE SEEN NONE SEEN   Trich, Wet Prep NONE SEEN NONE SEEN   Clue Cells Wet Prep HPF POC NONE SEEN NONE SEEN   WBC, Wet Prep HPF POC FEW (A) NONE SEEN   Sperm NONE SEEN    US Ob Comp Less 14 Wks  Result Date: 12/04/2016 CLINICAL DATA:  Spotting, abdominal pain. EXAM: OBSTETRIC <14 WK [place]  AND TRANSVAGINAL OB US TECHNIQUE: Both transabdominal and transvaginal ultrasound examinations were performed for complete evaluation of the gestation as well as the maternal uterus, adnexal regions, and pelvic cul-de-sac. Transvaginal technique was performed to assess early pregnancy. COMPARISON:  None. FINDINGS: Intrauterine gestational sac: None Yolk sac:  Not Visualized. Embryo:  Not Visualized. Cardiac Activity: Not Visualized. Heart Rate:   bpm MSD:   mm    w     d CRL:    mm    w    d                  [place] EDC: Subchorionic hemorrhage:  None visualized. Maternal uterus/adnexae: Endometrium 8 mm in thickness. No adnexal masses. Trace physiologic free fluid in the pelvis. IMPRESSION: No intrauterine pregnancy visualized. Differential considerations would include early intrauterine pregnancy too early to visualize, spontaneous abortion, or occult ectopic pregnancy. Recommend close clinical followup and serial quantitative beta HCGs and ultrasounds. Electronically Signed   By: Charlett Nose M.D.   On: 12/04/2016 10:06   US Ob Transvaginal  Result Date: 12/04/2016 CLINICAL DATA:  Spotting, abdominal pain. EXAM: OBSTETRIC <14 WK [place] AND TRANSVAGINAL OB US TECHNIQUE: Both transabdominal and transvaginal ultrasound examinations were performed for complete evaluation of the gestation as well as the maternal uterus, adnexal regions, and pelvic cul-de-sac. Transvaginal technique was performed to assess early pregnancy. COMPARISON:  None. FINDINGS: Intrauterine gestational sac: None Yolk sac:  Not Visualized. Embryo:  Not Visualized. Cardiac Activity: Not Visualized. Heart Rate:    bpm MSD:   mm    w     d CRL:    mm    w    d                  [place] EDC: Subchorionic hemorrhage:  None visualized. Maternal uterus/adnexae: Endometrium 8 mm in thickness. No adnexal masses. Trace physiologic free fluid in the pelvis. IMPRESSION: No intrauterine pregnancy visualized. Differential considerations would include early intrauterine pregnancy too early to visualize, spontaneous abortion, or occult ectopic pregnancy. Recommend close clinical followup and serial quantitative beta HCGs and ultrasounds. Electronically Signed   By: Charlett Nose M.D.   On: 12/04/2016 10:06    MAU Course  Procedures  MDM Labs ordered and reviewed. No emesis today, will provide Rx for Phenergan. No IUP seen on [place], likely too early but cannot r/o failed pregnancy or ectopic. No evidence of acute abdominal or pelvic process. Pain likely physiologic to early pregnancy. Stable for discharge home.   Assessment and Plan   1. Pregnancy, location unknown   2. Abdominal pain affecting pregnancy   3. Nausea/vomiting in pregnancy    Discharge home Follow up in MAU in 2 days for rpt bhcg Ectopic/SAB precautions Rx Phenergan  Allergies as of 12/04/2016      Reactions   Penicillins Hives, Other (See Comments)   Has patient had a PCN reaction causing immediate rash, facial/tongue/throat swelling, SOB or lightheadedness with hypotension: No Has patient had a PCN reaction causing severe rash involving mucus membranes or skin necrosis: No Has patient had a PCN reaction that required hospitalization No Has patient had a PCN reaction occurring within the last 10 years: No If all of the above answers are "NO", then may proceed with Cephalosporin use.      Medication List    STOP taking these medications   metroNIDAZOLE 500 MG tablet Commonly known as:  FLAGYL     TAKE these medications   promethazine 25  MG tablet Commonly known as:  PHENERGAN Take 1 tablet (25 mg total) by mouth every 6 (six) hours as needed for  nausea or vomiting.      Donette Larry, CNM 12/04/2016, 9:08 AM

## 2016-12-04 NOTE — Discharge Instructions (Signed)
Abdominal Pain During Pregnancy °Belly (abdominal) pain is common during pregnancy. Most of the time, it is not a serious problem. Other times, it can be a sign that something is wrong with the pregnancy. Always tell your doctor if you have belly pain. °Follow these instructions at home: °Monitor your belly pain for any changes. The following actions may help you feel better: °· Do not have sex (intercourse) or put anything in your vagina until you feel better. °· Rest until your pain stops. °· Drink clear fluids if you feel sick to your stomach (nauseous). Do not eat solid food until you feel better. °· Only take medicine as told by your doctor. °· Keep all doctor visits as told. °Get help right away if: °· You are bleeding, leaking fluid, or pieces of tissue come out of your vagina. °· You have more pain or cramping. °· You keep throwing up (vomiting). °· You have pain when you pee (urinate) or have blood in your pee. °· You have a fever. °· You do not feel your baby moving as much. °· You feel very weak or feel like passing out. °· You have trouble breathing, with or without belly pain. °· You have a very bad headache and belly pain. °· You have fluid leaking from your vagina and belly pain. °· You keep having watery poop (diarrhea). °· Your belly pain does not go away after resting, or the pain gets worse. °This information is not intended to replace advice given to you by your health care provider. Make sure you discuss any questions you have with your health care provider. °Document Released: 07/15/2009 Document Revised: 03/04/2016 Document Reviewed: 02/23/2013 °Elsevier Interactive Patient Education © 2017 [employer] ° °

## 2016-12-04 NOTE — MAU Note (Addendum)
Patient had 4 positive pregnancy tests at home, brought them with her.  spotting, vomiting and abdominal pain.

## 2016-12-06 ENCOUNTER — Inpatient Hospital Stay (HOSPITAL_COMMUNITY)
Admission: AD | Admit: 2016-12-06 | Discharge: 2016-12-06 | Disposition: A | Discharge status: Home / Self Care | Payer: Medicaid Other | Source: Ambulatory Visit | Attending: Obstetrics and Gynecology | Admitting: Obstetrics and Gynecology

## 2016-12-06 DIAGNOSIS — Z880 Allergy status to penicillin: Secondary | ICD-10-CM | POA: Insufficient documentation

## 2016-12-06 DIAGNOSIS — R197 Diarrhea, unspecified: Secondary | ICD-10-CM | POA: Insufficient documentation

## 2016-12-06 DIAGNOSIS — R109 Unspecified abdominal pain: Secondary | ICD-10-CM

## 2016-12-06 DIAGNOSIS — Z3201 Encounter for pregnancy test, result positive: Secondary | ICD-10-CM | POA: Insufficient documentation

## 2016-12-06 DIAGNOSIS — O3680X0 Pregnancy with inconclusive fetal viability, not applicable or unspecified: Secondary | ICD-10-CM

## 2016-12-06 DIAGNOSIS — Z3A01 Less than 8 weeks gestation of pregnancy: Secondary | ICD-10-CM | POA: Insufficient documentation

## 2016-12-06 DIAGNOSIS — O26891 Other specified pregnancy related conditions, first trimester: Secondary | ICD-10-CM

## 2016-12-06 LAB — HCG, QUANTITATIVE, PREGNANCY: HCG, BETA CHAIN, QUANT, S: 59 m[IU]/mL — AB (ref <5)

## 2016-12-06 MED ORDER — CONCEPT OB 130-92.4-1 MG PO CAPS: 1.0000 | ORAL_CAPSULE | Freq: Every day | ORAL | 12 refills | Status: DC

## 2016-12-06 NOTE — MAU Note (Signed)
Pt presents to MAU for repeat BHCG. Denies any vaginal bleeding, reports lower abdominal cramping.

## 2016-12-06 NOTE — Discharge Instructions (Signed)
Abdominal Pain During Pregnancy Abdominal pain is common in pregnancy. Most of the time, it does not cause harm. There are many causes of abdominal pain. Some causes are more serious than others and sometimes the cause is not known. Abdominal pain can be a sign that something is very wrong with the pregnancy or the pain may have nothing to do with the pregnancy. Always tell your health care provider if you have any abdominal pain. Follow these instructions at home:  Do not have sex or put anything in your vagina until your symptoms go away completely.  Watch your abdominal pain for any changes.  Get plenty of rest until your pain improves.  Drink enough fluid to keep your urine clear or pale yellow.  Take over-the-counter or prescription medicines only as told by your health care provider.  Keep all follow-up visits as told by your health care provider. This is important. Contact a health care provider if:  You have a fever.  Your pain gets worse or you have cramping.  Your pain continues after resting. Get help right away if:  You are bleeding, leaking fluid, or passing tissue from the vagina.  You have vomiting or diarrhea that does not go away.  You have painful or bloody urination.  You notice a decrease in your baby's movements.  You feel very weak or faint.  You have shortness of breath.  You develop a severe headache with abdominal pain.  You have abnormal vaginal discharge with abdominal pain. This information is not intended to replace advice given to you by your health care provider. Make sure you discuss any questions you have with your health care provider. Document Released: 07/27/2005 Document Revised: 05/07/2016 Document Reviewed: 02/23/2013 Elsevier Interactive Patient Education  2017 [employer] Inc[employer]   Ectopic Pregnancy An ectopic pregnancy is when the fertilized egg attaches (implants) outside the uterus. Most ectopic pregnancies occur in one of the tubes  where eggs travel from the ovary to the uterus (fallopian tubes), but the implanting can occur in other locations. In rare cases, ectopic pregnancies occur on the ovary, intestine, pelvis, abdomen, or cervix. In an ectopic pregnancy, the fertilized egg does not have the ability to develop into a normal, healthy baby. A ruptured ectopic pregnancy is one in which tearing or bursting of a fallopian tube causes internal bleeding. Often, there is intense lower abdominal pain, and vaginal bleeding sometimes occurs. Having an ectopic pregnancy can be life-threatening. If this dangerous condition is not treated, it can lead to blood loss, shock, or even death. What are the causes? The most common cause of this condition is damage to one of the fallopian tubes. A fallopian tube may be narrowed or blocked, and that keeps the fertilized egg from reaching the uterus. What increases the risk? This condition is more likely to develop in women of childbearing age who have different levels of risk. The levels of risk can be divided into three categories. High risk   You have gone through infertility treatment.  You have had an ectopic pregnancy before.  You have had surgery on the fallopian tubes, or another surgical procedure, such as an abortion.  You have had surgery to have the fallopian tubes tied (tubal ligation).  You have problems or diseases of the fallopian tubes.  You have been exposed to diethylstilbestrol (DES). This medicine was used until 1971, and it had effects on babies whose mothers took the medicine.  You become pregnant while using an IUD (intrauterine device) for  birth control. Moderate risk   You have a history of infertility.  You have had an STI (sexually transmitted infection).  You have a history of pelvic inflammatory disease (PID).  You have scarring from endometriosis.  You have multiple sexual partners.  You smoke. Low risk   You have had pelvic surgery.  You use  vaginal douches.  You became sexually active before age 89. What are the signs or symptoms? Common symptoms of this condition include normal pregnancy symptoms, such as missing a period, nausea, tiredness, abdominal pain, breast tenderness, and bleeding. However, ectopic pregnancy will have additional symptoms, such as:  Pain with intercourse.  Irregular vaginal bleeding or spotting.  Cramping or pain on one side or in the lower abdomen.  Fast heartbeat, low blood pressure, and sweating.  Passing out while having a bowel movement. Symptoms of a ruptured ectopic pregnancy and internal bleeding may include:  Sudden, severe pain in the abdomen and pelvis.  Dizziness, weakness, light-headedness, or fainting.  Pain in the shoulder or neck area. How is this diagnosed? This condition is diagnosed by:  A pelvic exam to locate pain or a mass in the abdomen.  A pregnancy test. This blood test checks for the presence as well as the specific level of pregnancy hormone in the bloodstream.  Ultrasound. This is performed if a pregnancy test is positive. In this test, a probe is inserted into the vagina. The probe will detect a fetus, possibly in a location other than the uterus.  Taking a sample of uterus tissue (dilation and curettage, or D&C).  Surgery to perform a visual exam of the inside of the abdomen using a thin, lighted tube that has a tiny camera on the end (laparoscope).  Culdocentesis. This procedure involves inserting a needle at the top of the vagina, behind the uterus. If blood is present in this area, it may indicate that a fallopian tube is torn. How is this treated? This condition is treated with medicine or surgery. Medicine   An injection of a medicine (methotrexate) may be given to cause the pregnancy tissue to be absorbed. This medicine may save your fallopian tube. It may be given if:  The diagnosis is made early, with no signs of active bleeding.  The fallopian  tube has not ruptured.  You are considered to be a good candidate for the medicine. Usually, pregnancy hormone blood levels are checked after methotrexate treatment. This is to be sure that the medicine is effective. It may take 4-6 weeks for the pregnancy to be absorbed. Most pregnancies will be absorbed by 3 weeks. Surgery   A laparoscope may be used to remove the pregnancy tissue.  If severe internal bleeding occurs, a larger cut (incision) may be made in the lower abdomen (laparotomy) to remove the fetus and placenta. This is done to stop the bleeding.  Part or all of the fallopian tube may be removed (salpingectomy) along with the fetus and placenta. The fallopian tube may also be repaired during the surgery.  In very rare circumstances, removal of the uterus (hysterectomy) may be required.  After surgery, pregnancy hormone testing may be done to be sure that there is no pregnancy tissue left. Whether your treatment is medicine or surgery, you may receive a Rho (D) immune globulin shot to prevent problems with any future pregnancy. This shot may be given if:  You are Rh-negative and the baby's father is Rh-positive.  You are Rh-negative and you do not know the Rh type  of the baby's father. Follow these instructions at home:  Rest and limit your activity after the procedure for as long as told by your health care provider.  Until your health care provider says that it is safe:  Do not lift anything that is heavier than 10 lb (4.5 kg), or the limit that your health care provider tells you.  Avoid physical exercise and any movement that requires effort (is strenuous).  To help prevent constipation:  Eat a healthy diet that includes fruits, vegetables, and whole grains.  Drink 6-8 glasses of water per day. Get help right away if:  You develop worsening pain that is not relieved by medicine.  You have:  A fever or chills.  Vaginal bleeding.  Redness and swelling at the  incision site.  Nausea and vomiting.  You feel dizzy or weak.  You feel light-headed or you faint. This information is not intended to replace advice given to you by your health care provider. Make sure you discuss any questions you have with your health care provider. Document Released: 09/03/2004 Document Revised: 03/25/2016 Document Reviewed: 02/26/2016 Elsevier Interactive Patient Education  2017 [employer].

## 2016-12-06 NOTE — MAU Provider Note (Signed)
History    First Provider Initiated Contact with Patient 12/06/16 [phone]      Chief Complaint:  Labs Only   Natalie Chavez is  21 y.o. G3P1001 Patient's last menstrual period was 11/10/2016.Marland Kitchen Patient is here for follow up of quantitative HCG and ongoing surveillance of pregnancy status.   She is [redacted]w[redacted]d weeks gestation  by LMP.    Since her last visit, the patient is with new complaint of diarrhea.     ROS Abdomin Pain: mild cramping Vaginal bleeding: None .   Passage of clots or tissue: None Dizziness: Denies  A POS  Her previous Quantitative HCG values are:  4/27: 22  Physical Exam   Patient Vitals for the past 24 hrs:  BP Temp Pulse Resp Weight  12/06/16 0850 127/84 99.1 F (37.3 C) (!) 116 16 224 lb (101.6 kg)   Constitutional: Well-nourished female in no apparent distress. No pallor Neuro: Alert and oriented 4 Cardiovascular: Normal rate Respiratory: Deferred Gynecological Exam: examination not indicated  Labs: Results for orders placed or performed during the hospital encounter of 12/06/16 (from the past 24 hour(s))  hCG, quantitative, pregnancy   Collection Time: 12/06/16  8:35 AM  Result Value Ref Range   hCG, Beta Chain, Quant, S 59 (H) <5 mIU/mL    Ultrasound Studies:   US Ob Comp Less 14 Wks  Result Date: 12/04/2016 CLINICAL DATA:  Spotting, abdominal pain. EXAM: OBSTETRIC <14 WK [place] AND TRANSVAGINAL OB US TECHNIQUE: Both transabdominal and transvaginal ultrasound examinations were performed for complete evaluation of the gestation as well as the maternal uterus, adnexal regions, and pelvic cul-de-sac. Transvaginal technique was performed to assess early pregnancy. COMPARISON:  None. FINDINGS: Intrauterine gestational sac: None Yolk sac:  Not Visualized. Embryo:  Not Visualized. Cardiac Activity: Not Visualized. Heart Rate:   bpm MSD:   mm    w     d CRL:    mm    w    d                  [place] EDC: Subchorionic hemorrhage:  None visualized. Maternal  uterus/adnexae: Endometrium 8 mm in thickness. No adnexal masses. Trace physiologic free fluid in the pelvis. IMPRESSION: No intrauterine pregnancy visualized. Differential considerations would include early intrauterine pregnancy too early to visualize, spontaneous abortion, or occult ectopic pregnancy. Recommend close clinical followup and serial quantitative beta HCGs and ultrasounds. Electronically Signed   By: Charlett Nose M.D.   On: 12/04/2016 10:06   US Ob Transvaginal  Result Date: 12/04/2016 CLINICAL DATA:  Spotting, abdominal pain. EXAM: OBSTETRIC <14 WK [place] AND TRANSVAGINAL OB US TECHNIQUE: Both transabdominal and transvaginal ultrasound examinations were performed for complete evaluation of the gestation as well as the maternal uterus, adnexal regions, and pelvic cul-de-sac. Transvaginal technique was performed to assess early pregnancy. COMPARISON:  None. FINDINGS: Intrauterine gestational sac: None Yolk sac:  Not Visualized. Embryo:  Not Visualized. Cardiac Activity: Not Visualized. Heart Rate:   bpm MSD:   mm    w     d CRL:    mm    w    d                  [place] EDC: Subchorionic hemorrhage:  None visualized. Maternal uterus/adnexae: Endometrium 8 mm in thickness. No adnexal masses. Trace physiologic free fluid in the pelvis. IMPRESSION: No intrauterine pregnancy visualized. Differential considerations would include early intrauterine pregnancy too early to visualize, spontaneous abortion, or occult ectopic pregnancy.  Recommend close clinical followup and serial quantitative beta HCGs and ultrasounds. Electronically Signed   By: Charlett Nose M.D.   On: 12/04/2016 10:06    MAU course/MDM: Quantitative hCG ordered  Pain and bleeding in early pregnancy with normal rise in Quant and hemodynamically stable.  Pain and bleeding in early pregnancy with abnormal rise in Quant, but hemodynamically stable.  Pain and bleeding in early pregnancy with drop in Quant, but hemodynamically  stable.  Assessment: [redacted]w[redacted]d weeks gestation w/ Nml rise in [place]. Cramping possibly from diarrhea Plan: Discharge home in stable condition. SAB and ectopic precautions Rx PNV Follow-up Information    [facility] [place] ULTRASOUND Follow up.   Specialty:  Radiology Why:  will call to schedule ultrasound Contact information: [address] [phone] mc [place] [address] [phone]       [facility] [place] MATERNITY ADMISSIONS Follow up.   Why:  in pregnancy emergencies Contact information: [address] [id number] mc [place] [address] [phone]         Allergies as of 12/06/2016      Reactions   Penicillins Hives, Other (See Comments)   Has patient had a PCN reaction causing immediate rash, facial/tongue/throat swelling, SOB or lightheadedness with hypotension: No Has patient had a PCN reaction causing severe rash involving mucus membranes or skin necrosis: No Has patient had a PCN reaction that required hospitalization No Has patient had a PCN reaction occurring within the last 10 years: No If all of the above answers are "NO", then may proceed with Cephalosporin use.      Medication List    TAKE these medications   CONCEPT OB 130-92.4-1 MG Caps Take 1 tablet by mouth daily.   promethazine 25 MG tablet Commonly known as:  PHENERGAN Take 1 tablet (25 mg total) by mouth every 6 (six) hours as needed for nausea or vomiting.       Dorathy Kinsman, CNM 12/06/2016, 9:31 AM  2/3

## 2016-12-07 LAB — GC/CHLAMYDIA PROBE AMP (~~LOC~~) NOT AT ARMC
CHLAMYDIA, DNA PROBE: NEGATIVE
NEISSERIA GONORRHEA: NEGATIVE

## 2016-12-08 ENCOUNTER — Other Ambulatory Visit: Payer: Self-pay | Admitting: Advanced Practice Midwife

## 2016-12-08 DIAGNOSIS — R109 Unspecified abdominal pain: Principal | ICD-10-CM

## 2016-12-08 DIAGNOSIS — O26899 Other specified pregnancy related conditions, unspecified trimester: Secondary | ICD-10-CM

## 2016-12-08 NOTE — Progress Notes (Signed)
F/U US ordered. Nml rise in quant.

## 2017-03-11 ENCOUNTER — Encounter (HOSPITAL_COMMUNITY): Payer: Self-pay | Admitting: *Deleted

## 2017-03-11 ENCOUNTER — Inpatient Hospital Stay (HOSPITAL_COMMUNITY)
Admission: AD | Admit: 2017-03-11 | Discharge: 2017-03-11 | Disposition: A | Discharge status: Home / Self Care | Payer: Medicaid Other | Source: Ambulatory Visit | Attending: Obstetrics and Gynecology | Admitting: Obstetrics and Gynecology

## 2017-03-11 DIAGNOSIS — N760 Acute vaginitis: Secondary | ICD-10-CM | POA: Insufficient documentation

## 2017-03-11 DIAGNOSIS — Z880 Allergy status to penicillin: Secondary | ICD-10-CM | POA: Insufficient documentation

## 2017-03-11 DIAGNOSIS — J069 Acute upper respiratory infection, unspecified: Secondary | ICD-10-CM | POA: Insufficient documentation

## 2017-03-11 DIAGNOSIS — Z3202 Encounter for pregnancy test, result negative: Secondary | ICD-10-CM | POA: Insufficient documentation

## 2017-03-11 DIAGNOSIS — B9689 Other specified bacterial agents as the cause of diseases classified elsewhere: Secondary | ICD-10-CM | POA: Insufficient documentation

## 2017-03-11 DIAGNOSIS — F17210 Nicotine dependence, cigarettes, uncomplicated: Secondary | ICD-10-CM | POA: Insufficient documentation

## 2017-03-11 LAB — URINALYSIS, ROUTINE W REFLEX MICROSCOPIC
Bilirubin Urine: NEGATIVE
Glucose, UA: NEGATIVE mg/dL
Ketones, ur: NEGATIVE mg/dL
LEUKOCYTES UA: NEGATIVE
NITRITE: NEGATIVE
PH: 5 (ref 5.0–8.0)
Protein, ur: NEGATIVE mg/dL
SPECIFIC GRAVITY, URINE: 1.023 (ref 1.005–1.030)

## 2017-03-11 LAB — CBC
HCT: 37 % (ref 36.0–46.0)
Hemoglobin: 12.3 g/dL (ref 12.0–15.0)
MCH: 29.2 pg (ref 26.0–34.0)
MCHC: 33.2 g/dL (ref 30.0–36.0)
MCV: 87.9 fL (ref 78.0–100.0)
PLATELETS: 198 10*3/uL (ref 150–400)
RBC: 4.21 MIL/uL (ref 3.87–5.11)
RDW: 12.9 % (ref 11.5–15.5)
WBC: 9.1 10*3/uL (ref 4.0–10.5)

## 2017-03-11 LAB — WET PREP, GENITAL
Sperm: NONE SEEN
Trich, Wet Prep: NONE SEEN
YEAST WET PREP: NONE SEEN

## 2017-03-11 LAB — POCT PREGNANCY, URINE: PREG TEST UR: NEGATIVE

## 2017-03-11 MED ORDER — ONDANSETRON 8 MG PO TBDP
8.0000 mg | ORAL_TABLET | Freq: Once | ORAL | Status: AC
  Administered 2017-03-11: 8 mg via ORAL
  Filled 2017-03-11: qty 1

## 2017-03-11 MED ORDER — METRONIDAZOLE 500 MG PO TABS: 500.0000 mg | ORAL_TABLET | Freq: Two times a day (BID) | ORAL | 0 refills | Status: DC

## 2017-03-11 NOTE — Discharge Instructions (Signed)

## 2017-03-11 NOTE — MAU Note (Signed)
+  sore throat/cold symptoms x1 week "off and on"  +vomiting for past week  +vaginal discharge Yellow, thick, odorous Noticed in the last day  +dysuria

## 2017-03-11 NOTE — MAU Provider Note (Signed)
History     CSN: [id number]  Arrival date and time: 03/11/17 [id number]   First Provider Initiated Contact with Patient 03/11/17 1038      Chief Complaint  Patient presents with  . Emesis  . Vaginal Discharge   HPI   Ms.Natalie Chavez is a 21 y.o. female 582P1011 non pregnant female here with emesis and vaginal discharge. She was here for these exact complaints in April, does not have a PCP. 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 Says she has vomited twice in the last 24 hours after a coughing spell. The vaginal discharge started 1 week ago. The discharge is thick and yellow with an odor.  No new sexual partners.   OB History as of 03/10/17    Gravida Para Term Preterm AB Living   3 1 1     1    SAB TAB Ectopic Multiple Live Births           1      Past Medical History:  Diagnosis Date  . Depression    post partum depression for two weeks  . Infection    UTI  . Pre-eclampsia   . Pregnancy induced hypertension   . Urinary tract infection     Past Surgical History:  Procedure Laterality Date  . CESAREAN SECTION N/A 06/21/2013   Procedure: CESAREAN SECTION;  Surgeon: Purcell NailsAngela Y Roberts, MD;  Location: [facility];  Service: Obstetrics;  Laterality: N/A;  . HERNIA REPAIR    . THERAPEUTIC ABORTION      Family History  Problem Relation Age of Onset  . Diabetes Mother   . Hyperlipidemia Mother   . Asthma Mother   . Hypertension Mother   . Hyperlipidemia Father   . Hypertension Father   . Cancer Maternal Grandmother   . Kidney disease Maternal Grandmother   . Cancer Maternal Aunt        Rectal  . Hypertension Maternal Aunt     Social History  Substance Use Topics  . Smoking status: Current Every Day Smoker    Packs/day: 0.25    Years: 1.00    Types: Cigarettes  . Smokeless tobacco: Never Used  . Alcohol use Yes     Comment: Occasional    Allergies:  Allergies  Allergen Reactions  . Penicillins Hives and Other (See Comments)   Has patient had a PCN reaction causing immediate rash, facial/tongue/throat swelling, SOB or lightheadedness with hypotension: No Has patient had a PCN reaction causing severe rash involving mucus membranes or skin necrosis: No Has patient had a PCN reaction that required hospitalization No Has patient had a PCN reaction occurring within the last 10 years: No If all of the above answers are "NO", then may proceed with Cephalosporin use.    Prescriptions Prior to Admission  Medication Sig Dispense Refill Last Dose  . Dextromethorphan HBr (SUCRETS 4-HOUR COUGH DROPS MT) Use as directed 1 tablet in the mouth or throat as needed (cough).   Past Week at Unknown time  . Prenat w/o A Vit-FeFum-FePo-FA (CONCEPT OB) 130-92.4-1 MG CAPS Take 1 tablet by mouth daily. 30 capsule 12   . promethazine (PHENERGAN) 25 MG tablet Take 1 tablet (25 mg total) by mouth every 6 (six) hours as needed for nausea or vomiting. 30 tablet 0    Results for orders placed or performed during the hospital encounter of 03/11/17 (from the past 48 hour(s))  Urinalysis, Routine w reflex microscopic  Status: Abnormal   Collection Time: 03/11/17  8:47 AM  Result Value Ref Range   Color, Urine YELLOW YELLOW   APPearance CLOUDY (A) CLEAR   Specific Gravity, Urine 1.023 1.005 - 1.030   pH 5.0 5.0 - 8.0   Glucose, UA NEGATIVE NEGATIVE mg/dL   Hgb urine dipstick SMALL (A) NEGATIVE   Bilirubin Urine NEGATIVE NEGATIVE   Ketones, ur NEGATIVE NEGATIVE mg/dL   Protein, ur NEGATIVE NEGATIVE mg/dL   Nitrite NEGATIVE NEGATIVE   Leukocytes, UA NEGATIVE NEGATIVE   RBC / HPF 0-5 0 - 5 RBC/hpf   WBC, UA 0-5 0 - 5 WBC/hpf   Bacteria, UA RARE (A) NONE SEEN   Squamous Epithelial / LPF 6-30 (A) NONE SEEN   Mucous PRESENT   Pregnancy, urine POC     Status: None   Collection Time: 03/11/17  8:57 AM  Result Value Ref Range   Preg Test, Ur NEGATIVE NEGATIVE    Comment:        THE SENSITIVITY OF THIS METHODOLOGY IS >24 mIU/mL   CBC      Status: None   Collection Time: 03/11/17 10:00 AM  Result Value Ref Range   WBC 9.1 4.0 - 10.5 K/uL   RBC 4.21 3.87 - 5.11 MIL/uL   Hemoglobin 12.3 12.0 - 15.0 g/dL   HCT 16.137.0 09.636.0 - 04.546.0 %   MCV 87.9 78.0 - 100.0 fL   MCH 29.2 26.0 - 34.0 pg   MCHC 33.2 30.0 - 36.0 g/dL   RDW 40.912.9 81.111.5 - 91.415.5 %   Platelets 198 150 - 400 K/uL  Wet prep, genital     Status: Abnormal   Collection Time: 03/11/17 10:19 AM  Result Value Ref Range   Yeast Wet Prep HPF POC NONE SEEN NONE SEEN   Trich, Wet Prep NONE SEEN NONE SEEN   Clue Cells Wet Prep HPF POC PRESENT (A) NONE SEEN   WBC, Wet Prep HPF POC FEW (A) NONE SEEN    Comment: MODERATE BACTERIA SEEN   Sperm NONE SEEN     Review of Systems  Constitutional: Negative for fever.  HENT: Positive for sore throat.   Gastrointestinal: Negative for abdominal pain.   Physical Exam   Blood pressure 116/74, pulse 88, temperature 97.7 F (36.5 C), temperature source Oral, resp. rate 16, weight 222 lb 1.9 oz (100.8 kg), last menstrual period 11/10/2016, SpO2 100 %, unknown if currently breastfeeding.  Physical Exam  Constitutional: She is oriented to person, place, and time. She appears well-developed and well-nourished. No distress.  HENT:  Head: Normocephalic.  Eyes: Pupils are equal, round, and reactive to light.  Genitourinary:  Genitourinary Comments: Wet prep and Gc collected by RN   Musculoskeletal: Normal range of motion.  Neurological: She is alert and oriented to person, place, and time.  Skin: Skin is warm. She is not diaphoretic.  Psychiatric: Her behavior is normal.    MAU Course  Procedures  None  MDM  UA Wet prep & GC  Assessment and Plan   A:  1. Upper respiratory virus   2. BV (bacterial vaginosis)     P:  Discharge home in stable condition Rx: Flagyl No alcohol while taking Return to MAU for emergencies only Follow up with PCP or GYN  Rasch, Harolyn RutherfordJennifer I, NP 03/11/2017 11:33 AM

## 2017-03-12 LAB — GC/CHLAMYDIA PROBE AMP (~~LOC~~) NOT AT ARMC
CHLAMYDIA, DNA PROBE: NEGATIVE
Neisseria Gonorrhea: NEGATIVE

## 2017-03-12 LAB — HIV ANTIBODY (ROUTINE TESTING W REFLEX): HIV Screen 4th Generation wRfx: NONREACTIVE

## 2019-02-25 ENCOUNTER — Emergency Department (HOSPITAL_COMMUNITY): Payer: No Typology Code available for payment source

## 2019-02-25 ENCOUNTER — Emergency Department
Admission: EM | Admit: 2019-02-25 | Discharge: 2019-02-25 | Disposition: A | Discharge status: Home / Self Care | Payer: Self-pay | Attending: Emergency Medicine | Admitting: Emergency Medicine

## 2019-02-25 ENCOUNTER — Emergency Department (HOSPITAL_COMMUNITY)
Admission: EM | Admit: 2019-02-25 | Discharge: 2019-02-25 | Disposition: A | Discharge status: Home / Self Care | Payer: No Typology Code available for payment source | Attending: Emergency Medicine | Admitting: Emergency Medicine

## 2019-02-25 ENCOUNTER — Encounter: Payer: Self-pay | Admitting: *Deleted

## 2019-02-25 ENCOUNTER — Other Ambulatory Visit: Payer: Self-pay

## 2019-02-25 ENCOUNTER — Encounter (HOSPITAL_COMMUNITY): Payer: Self-pay | Admitting: Emergency Medicine

## 2019-02-25 DIAGNOSIS — S39012A Strain of muscle, fascia and tendon of lower back, initial encounter: Secondary | ICD-10-CM

## 2019-02-25 DIAGNOSIS — R51 Headache: Secondary | ICD-10-CM | POA: Insufficient documentation

## 2019-02-25 DIAGNOSIS — M79646 Pain in unspecified finger(s): Secondary | ICD-10-CM | POA: Insufficient documentation

## 2019-02-25 DIAGNOSIS — S40022A Contusion of left upper arm, initial encounter: Secondary | ICD-10-CM | POA: Insufficient documentation

## 2019-02-25 DIAGNOSIS — S29012A Strain of muscle and tendon of back wall of thorax, initial encounter: Secondary | ICD-10-CM | POA: Insufficient documentation

## 2019-02-25 DIAGNOSIS — R0789 Other chest pain: Secondary | ICD-10-CM | POA: Diagnosis not present

## 2019-02-25 DIAGNOSIS — Y93I9 Activity, other involving external motion: Secondary | ICD-10-CM | POA: Insufficient documentation

## 2019-02-25 DIAGNOSIS — F17210 Nicotine dependence, cigarettes, uncomplicated: Secondary | ICD-10-CM | POA: Diagnosis not present

## 2019-02-25 DIAGNOSIS — R1030 Lower abdominal pain, unspecified: Secondary | ICD-10-CM | POA: Diagnosis not present

## 2019-02-25 DIAGNOSIS — S638X1A Sprain of other part of right wrist and hand, initial encounter: Secondary | ICD-10-CM | POA: Insufficient documentation

## 2019-02-25 DIAGNOSIS — S40021A Contusion of right upper arm, initial encounter: Secondary | ICD-10-CM | POA: Diagnosis not present

## 2019-02-25 DIAGNOSIS — Y999 Unspecified external cause status: Secondary | ICD-10-CM | POA: Diagnosis not present

## 2019-02-25 DIAGNOSIS — U071 COVID-19: Secondary | ICD-10-CM | POA: Diagnosis not present

## 2019-02-25 DIAGNOSIS — S63501A Unspecified sprain of right wrist, initial encounter: Secondary | ICD-10-CM

## 2019-02-25 DIAGNOSIS — T07XXXA Unspecified multiple injuries, initial encounter: Secondary | ICD-10-CM

## 2019-02-25 DIAGNOSIS — S199XXA Unspecified injury of neck, initial encounter: Secondary | ICD-10-CM | POA: Diagnosis present

## 2019-02-25 DIAGNOSIS — Z5321 Procedure and treatment not carried out due to patient leaving prior to being seen by health care provider: Secondary | ICD-10-CM | POA: Insufficient documentation

## 2019-02-25 DIAGNOSIS — Y92414 Local residential or business street as the place of occurrence of the external cause: Secondary | ICD-10-CM | POA: Insufficient documentation

## 2019-02-25 LAB — CBC WITH DIFFERENTIAL/PLATELET
Abs Immature Granulocytes: 0.01 10*3/uL (ref 0.00–0.07)
Basophils Absolute: 0 10*3/uL (ref 0.0–0.1)
Basophils Relative: 0 %
Eosinophils Absolute: 0 10*3/uL (ref 0.0–0.5)
Eosinophils Relative: 1 %
HCT: 39.4 % (ref 36.0–46.0)
Hemoglobin: 12.2 g/dL (ref 12.0–15.0)
Immature Granulocytes: 0 %
Lymphocytes Relative: 34 %
Lymphs Abs: 1.9 10*3/uL (ref 0.7–4.0)
MCH: 28.3 pg (ref 26.0–34.0)
MCHC: 31 g/dL (ref 30.0–36.0)
MCV: 91.4 fL (ref 80.0–100.0)
Monocytes Absolute: 0.7 10*3/uL (ref 0.1–1.0)
Monocytes Relative: 12 %
Neutro Abs: 3 10*3/uL (ref 1.7–7.7)
Neutrophils Relative %: 53 %
Platelets: 207 10*3/uL (ref 150–400)
RBC: 4.31 MIL/uL (ref 3.87–5.11)
RDW: 13 % (ref 11.5–15.5)
WBC: 5.6 10*3/uL (ref 4.0–10.5)
nRBC: 0 % (ref 0.0–0.2)

## 2019-02-25 LAB — COMPREHENSIVE METABOLIC PANEL
ALT: 35 U/L (ref 0–44)
AST: 28 U/L (ref 15–41)
Albumin: 3.3 g/dL — ABNORMAL LOW (ref 3.5–5.0)
Alkaline Phosphatase: 55 U/L (ref 38–126)
Anion gap: 11 (ref 5–15)
BUN: 12 mg/dL (ref 6–20)
CO2: 23 mmol/L (ref 22–32)
Calcium: 8.6 mg/dL — ABNORMAL LOW (ref 8.9–10.3)
Chloride: 103 mmol/L (ref 98–111)
Creatinine, Ser: 0.6 mg/dL (ref 0.44–1.00)
GFR calc Af Amer: >60 mL/min (ref >60)
GFR calc non Af Amer: >60 mL/min (ref >60)
Glucose, Bld: 111 mg/dL — ABNORMAL HIGH (ref 70–99)
Potassium: 3.7 mmol/L (ref 3.5–5.1)
Sodium: 137 mmol/L (ref 135–145)
Total Bilirubin: 0.1 mg/dL — ABNORMAL LOW (ref 0.3–1.2)
Total Protein: 7.3 g/dL (ref 6.5–8.1)

## 2019-02-25 LAB — SARS CORONAVIRUS 2 BY RT PCR (HOSPITAL ORDER, PERFORMED IN ~~LOC~~ HOSPITAL LAB): SARS Coronavirus 2: POSITIVE — AB

## 2019-02-25 LAB — I-STAT BETA HCG BLOOD, ED (MC, WL, AP ONLY): I-stat hCG, quantitative: <5 m[IU]/mL (ref <5)

## 2019-02-25 MED ORDER — AZITHROMYCIN 250 MG PO TABS: ORAL_TABLET | ORAL | 0 refills | Status: DC

## 2019-02-25 MED ORDER — IOHEXOL 300 MG/ML  SOLN
50.0000 mL | Freq: Once | INTRAMUSCULAR | Status: AC | PRN
  Administered 2019-02-25: 18:00:00 50 mL via INTRAVENOUS

## 2019-02-25 MED ORDER — SODIUM CHLORIDE (PF) 0.9 % IJ SOLN
INTRAMUSCULAR | Status: AC
  Filled 2019-02-25: qty 50

## 2019-02-25 MED ORDER — IOHEXOL 300 MG/ML  SOLN
100.0000 mL | Freq: Once | INTRAMUSCULAR | Status: AC | PRN
  Administered 2019-02-25: 17:00:00 100 mL via INTRAVENOUS
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
## 2019-02-25 NOTE — Discharge Instructions (Addendum)
Use ibuprofen for symptomatic relief.  Make sure you are drinking fluids and staying hydrated.  Take all the antibiotics as directed.  You need to stay quarantined for at least 10 days from the onset of symptoms and need to be fever free for 3 days and have 3 days of improving symptoms before he can in the quarantine.  Return to emergency room if you have any worsening symptoms including shortness of breath, worsening abdominal pain, ongoing vomiting or other worsening symptoms.
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
## 2019-02-25 NOTE — ED Notes (Signed)
Below order not completed by EW. 
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
## 2019-08-18 ENCOUNTER — Ambulatory Visit
Admission: EM | Admit: 2019-08-18 | Discharge: 2019-08-18 | Disposition: A | Discharge status: Home / Self Care | Payer: Medicaid Other | Attending: Emergency Medicine | Admitting: Emergency Medicine

## 2019-08-18 ENCOUNTER — Other Ambulatory Visit: Payer: Self-pay

## 2019-08-18 ENCOUNTER — Encounter: Payer: Self-pay | Admitting: Emergency Medicine

## 2019-08-18 DIAGNOSIS — N898 Other specified noninflammatory disorders of vagina: Secondary | ICD-10-CM

## 2019-08-18 DIAGNOSIS — Z202 Contact with and (suspected) exposure to infections with a predominantly sexual mode of transmission: Secondary | ICD-10-CM | POA: Insufficient documentation

## 2019-08-18 DIAGNOSIS — R197 Diarrhea, unspecified: Secondary | ICD-10-CM | POA: Insufficient documentation

## 2019-08-18 MED ORDER — FLUCONAZOLE 150 MG PO TABS: 150.0000 mg | ORAL_TABLET | Freq: Every day | ORAL | 0 refills | Status: DC

## 2019-08-18 MED ORDER — METRONIDAZOLE 0.75 % VA GEL: 1.0000 | Freq: Two times a day (BID) | VAGINAL | 0 refills | Status: DC

## 2019-08-18 NOTE — ED Triage Notes (Signed)
Patient in office today c/o would like to have a std check also complains of diarrhea x 83mo  Discharge has odor,white thick no pain Diarrhea has been ongoing x 80mo stated that everything runs through her stool is runny.  EFE:OFHQR

## 2019-08-18 NOTE — Discharge Instructions (Addendum)
Take the Diflucan and use the MetroGel as directed.  Do not have sex for 7 days.  Your STD tests are pending.  If your test results are positive, we will call you.  You may need additional treatment and your partner(s) may also need treatment.      Follow the diet for diarrhea attached.  Follow-up with your primary care provider if your symptoms are not improving.

## 2019-08-18 NOTE — ED Provider Notes (Signed)
Natalie Chavez    CSN: [id number] Arrival date & time: 08/18/19  1005      History   Chief Complaint Chief Complaint  Patient presents with  . Diarrhea  . Exposure to STD    HPI Natalie Chavez is a 24 y.o. female.   Patient presents with malodorous white vaginal discharge x1 week.  She is sexually active and does not use condoms.  Patient states she has a history of bacterial vaginitis and prefers treatment with MetroGel.  She denies history of STDs.  She also reports intermittent diarrhea x2 months.  She denies fever, chills, sore throat, cough, shortness of breath, abdominal pain, vomiting, rash, lesions, pelvic pain, or other symptoms.  She has attempted to treat her diarrhea at home with Pepto-Bismol.  She denies pregnancy or breastfeeding.    The history is provided by the patient.    Past Medical History:  Diagnosis Date  . Depression    post partum depression for two weeks  . Infection    UTI  . Pre-eclampsia   . Pregnancy induced hypertension   . Urinary tract infection     Patient Active Problem List   Diagnosis Date Noted  . H/O cesarean section 06/22/2013  . History of pre-eclampsia 06/20/2013  . Drug overdose 06/18/2013    Past Surgical History:  Procedure Laterality Date  . CESAREAN SECTION N/A 06/21/2013   Procedure: CESAREAN SECTION;  Surgeon: Purcell Nails, MD;  Location: [facility];  Service: Obstetrics;  Laterality: N/A;  . HERNIA REPAIR    . THERAPEUTIC ABORTION      OB History    Gravida  2   Para  1   Term  1   Preterm      AB  1   Living  1     SAB      TAB  1   Ectopic      Multiple      Live Births  1            Home Medications    Prior to Admission medications   Medication Sig Start Date End Date Taking? Authorizing Provider  acetaminophen (TYLENOL) 325 MG tablet Take 650 mg by mouth daily as needed for headache.    [provider]  azithromycin (ZITHROMAX Z-PAK) 250 MG tablet 2 po day  one, then 1 daily x 4 days 02/25/19   Rolan Bucco, MD  fluconazole (DIFLUCAN) 150 MG tablet Take 1 tablet (150 mg total) by mouth daily. Take one tablet today.  May repeat in 3 days. 08/18/19   Mickie Bail, NP  metroNIDAZOLE (FLAGYL) 500 MG tablet Take 1 tablet (500 mg total) by mouth 2 (two) times daily. Patient not taking: Reported on 02/25/2019 03/11/17   Rasch, Victorino Dike I, NP  metroNIDAZOLE (METROGEL VAGINAL) 0.75 % vaginal gel Place 1 Applicatorful vaginally 2 (two) times daily. 08/18/19   Mickie Bail, NP  Prenat w/o A Vit-FeFum-FePo-FA (CONCEPT OB) 130-92.4-1 MG CAPS Take 1 tablet by mouth daily. Patient not taking: Reported on 02/25/2019 12/06/16   Katrinka Blazing, [place], CNM  promethazine (PHENERGAN) 25 MG tablet Take 1 tablet (25 mg total) by mouth every 6 (six) hours as needed for nausea or vomiting. Patient not taking: Reported on 02/25/2019 12/04/16   Donette Larry, CNM    Family History Family History  Problem Relation Age of Onset  . Diabetes Mother   . Hyperlipidemia Mother   . Asthma Mother   . Hypertension Mother   .  Hyperlipidemia Father   . Hypertension Father   . Cancer Maternal Grandmother   . Kidney disease Maternal Grandmother   . Cancer Maternal Aunt        Rectal  . Hypertension Maternal Aunt     Social History Social History   Tobacco Use  . Smoking status: Current Every Day Smoker    Packs/day: 0.25    Years: 1.00    Pack years: 0.25    Types: Cigarettes  . Smokeless tobacco: Never Used  Substance Use Topics  . Alcohol use: Yes    Comment: Occasional  . Drug use: No     Allergies   Penicillins   Review of Systems Review of Systems  Constitutional: Negative for chills and fever.  HENT: Negative for ear pain and sore throat.   Eyes: Negative for pain and visual disturbance.  Respiratory: Negative for cough and shortness of breath.   Cardiovascular: Negative for chest pain and palpitations.  Gastrointestinal: Positive for diarrhea. Negative for  abdominal pain, nausea and vomiting.  Genitourinary: Positive for vaginal discharge. Negative for dysuria, flank pain, hematuria and pelvic pain.  Musculoskeletal: Negative for arthralgias and back pain.  Skin: Negative for color change and rash.  Neurological: Negative for seizures and syncope.  All other systems reviewed and are negative.    Physical Exam Triage Vital Signs ED Triage Vitals  Enc Vitals Group     BP      Pulse      Resp      Temp      Temp src      SpO2      Weight      Height      Head Circumference      Peak Flow      Pain Score      Pain Loc      Pain Edu?      Excl. in [place]?    No data found.  Updated Vital Signs BP 134/86 (BP Location: Left Arm)   Pulse 94   Temp 99.3 F (37.4 C) (Oral)   Resp 16   Ht 5\' 5"  (1.651 m)   Wt 253 lb (114.8 kg)   LMP 07/22/2019   SpO2 96%   BMI 42.10 kg/m   Visual Acuity Right Eye Distance:   Left Eye Distance:   Bilateral Distance:    Right Eye Near:   Left Eye Near:    Bilateral Near:     Physical Exam Vitals and nursing note reviewed.  Constitutional:      General: She is not in acute distress.    Appearance: She is well-developed. She is obese. She is not ill-appearing.  HENT:     Head: Normocephalic and atraumatic.     Mouth/Throat:     Mouth: Mucous membranes are moist.     Pharynx: Oropharynx is clear.  Eyes:     Conjunctiva/sclera: Conjunctivae normal.  Cardiovascular:     Rate and Rhythm: Normal rate and regular rhythm.     Heart sounds: No murmur.  Pulmonary:     Effort: Pulmonary effort is normal. No respiratory distress.     Breath sounds: Normal breath sounds.  Abdominal:     General: Bowel sounds are normal.     Palpations: Abdomen is soft.     Tenderness: There is no abdominal tenderness. There is no right CVA tenderness, left CVA tenderness, guarding or rebound.  Musculoskeletal:     Cervical back: Neck supple.  Skin:  General: Skin is warm and dry.     Findings: No rash.   Neurological:     General: No focal deficit present.     Mental Status: She is alert and oriented to person, place, and time.  Psychiatric:        Mood and Affect: Mood normal.        Behavior: Behavior normal.      UC Treatments / Results  Labs (all labs ordered are listed, but only abnormal results are displayed) Labs Reviewed  CERVICOVAGINAL ANCILLARY ONLY    EKG   Radiology No results found.  Procedures Procedures (including critical care time)  Medications Ordered in UC Medications - No data to display  Initial Impression / Assessment and Plan / UC Course  I have reviewed the triage vital signs and the nursing notes.  Pertinent labs & imaging results that were available during my care of the patient were reviewed by me and considered in my medical decision making (see chart for details).    Vaginal discharge, possible exposure to STD, diarrhea.  Vaginal self swab for STD testing obtained by patient.  Treating with Diflucan and MetroGel (patient states Flagyl pills make her nauseated).  Discussed with patient that if her test results are positive, we will call her.  Discussed that she and her sexual partners may require additional treatment at that time.  Instructed patient to follow a diet for diarrhea; handout provided.  Instructed her to follow-up with her PCP if her symptoms are not improving.  Patient agrees to plan of care.     Final Clinical Impressions(s) / UC Diagnoses   Final diagnoses:  Vaginal discharge  Possible exposure to STD  Diarrhea, unspecified type     Discharge Instructions     Take the Diflucan and use the MetroGel as directed.  Do not have sex for 7 days.  Your STD tests are pending.  If your test results are positive, we will call you.  You may need additional treatment and your partner(s) may also need treatment.      Follow the diet for diarrhea attached.  Follow-up with your primary care provider if your symptoms are not  improving.         ED Prescriptions    Medication Sig Dispense Auth. Provider   metroNIDAZOLE (METROGEL VAGINAL) 0.75 % vaginal gel Place 1 Applicatorful vaginally 2 (two) times daily. 70 g Mickie Bail, NP   fluconazole (DIFLUCAN) 150 MG tablet Take 1 tablet (150 mg total) by mouth daily. Take one tablet today.  May repeat in 3 days. 2 tablet Mickie Bail, NP     PDMP not reviewed this encounter.   Mickie Bail, NP 08/18/19 1037

## 2019-08-22 LAB — CERVICOVAGINAL ANCILLARY ONLY
Bacterial vaginitis: NEGATIVE
Candida vaginitis: NEGATIVE
Chlamydia: NEGATIVE
Neisseria Gonorrhea: NEGATIVE
Trichomonas: NEGATIVE

## 2020-10-05 IMAGING — CT CT HEAD WITHOUT CONTRAST
3 series · 14 of 47 positions shown, 16 images · non-contrast
Comparison: None.

CLINICAL DATA: Head trauma.

EXAM:
CT HEAD WITHOUT CONTRAST
CT CERVICAL SPINE WITHOUT CONTRAST
TECHNIQUE: Multidetector CT imaging of the head and cervical spine was
performed following the standard protocol without intravenous
contrast. Multiplanar CT image reconstructions of the cervical spine
were also generated.

[Series 3: head wo · axial · 0.47mm/px · z∈[-161,-36]mm · 8 of 31 slices shown, 10 images]
[im 3/31  brain]
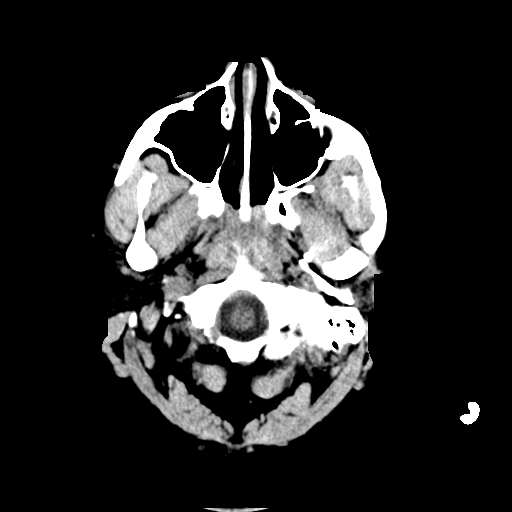
[im 3/31  bone]
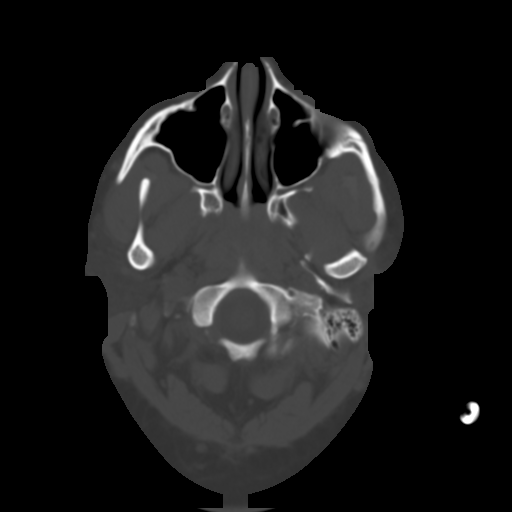
[im 7/31  brain]
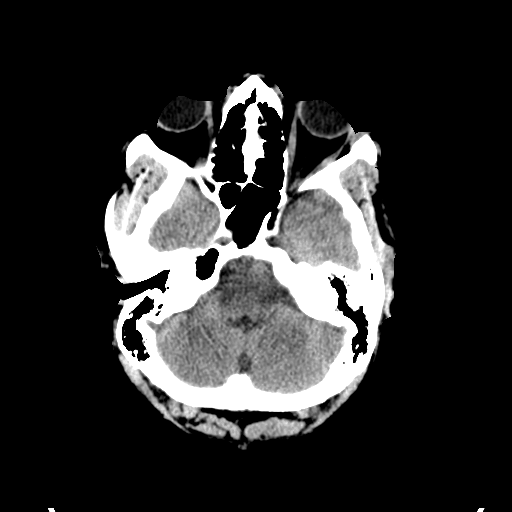
[im 10/31  brain]
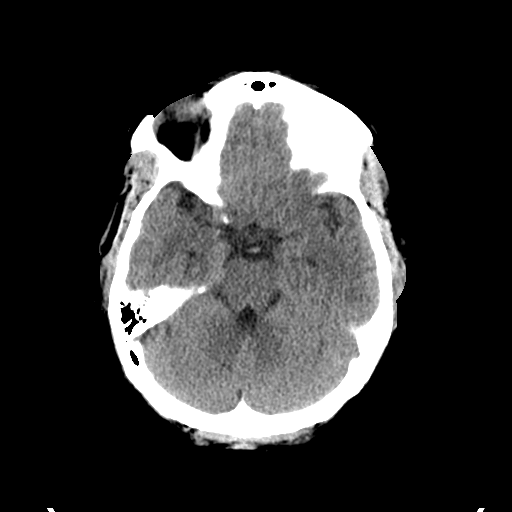
[im 14/31  brain]
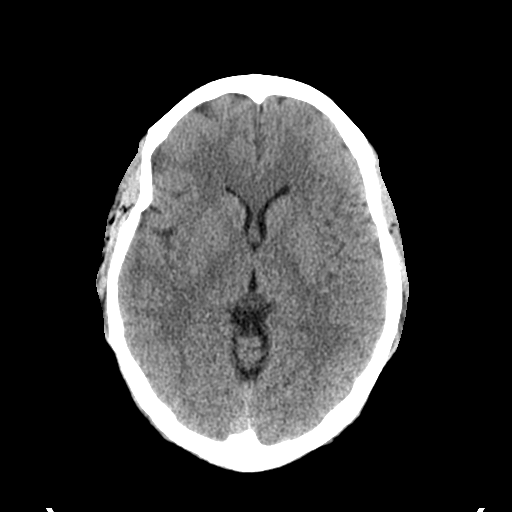
[im 17/31  brain]
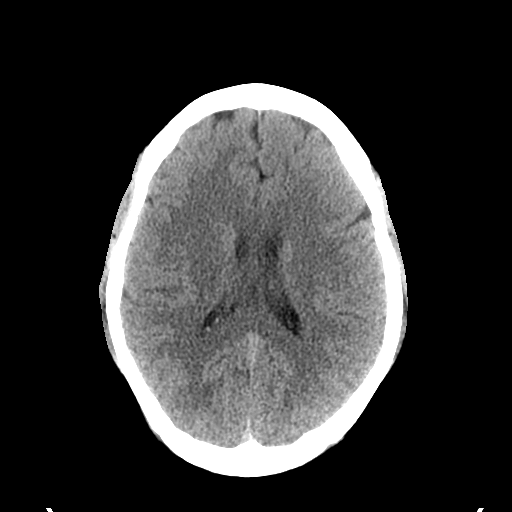
[im 17/31  bone]
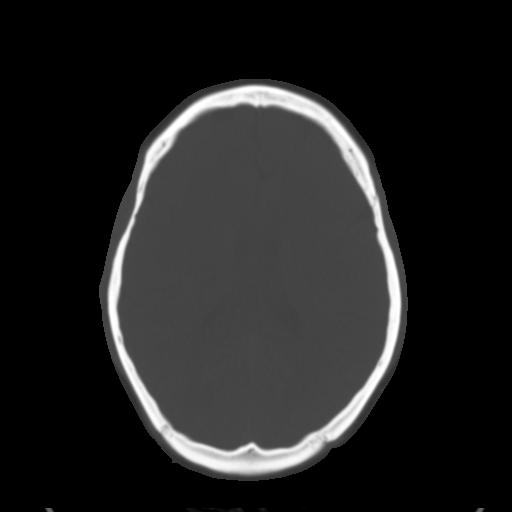
[im 21/31  brain]
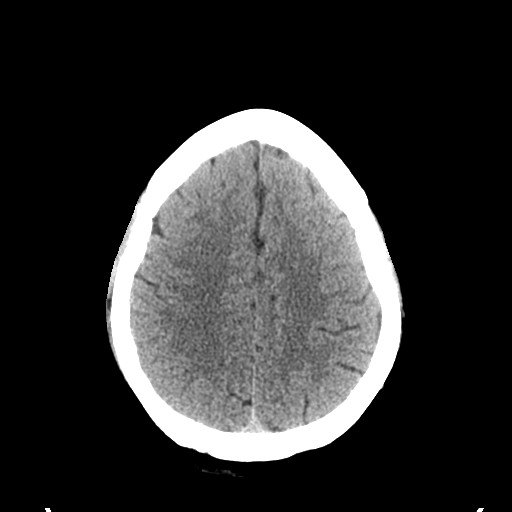
[im 24/31  brain]
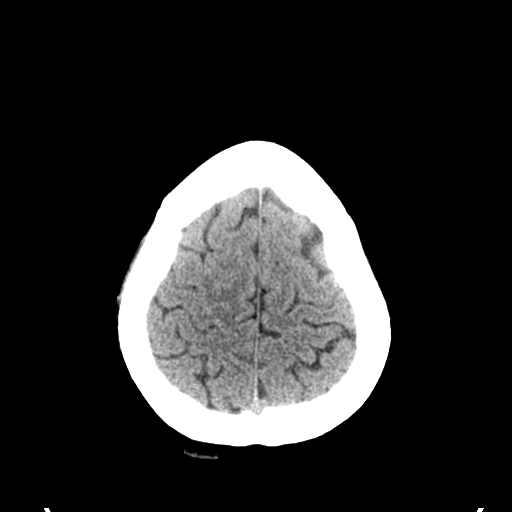
[im 28/31  brain]
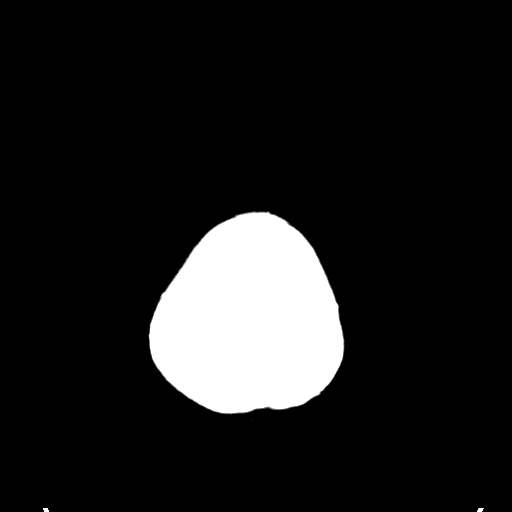

[Series 5: coronal soft tissue · coronal · 0.30mm/px · 3 of 62 slices shown]
[im 21/62  brain]
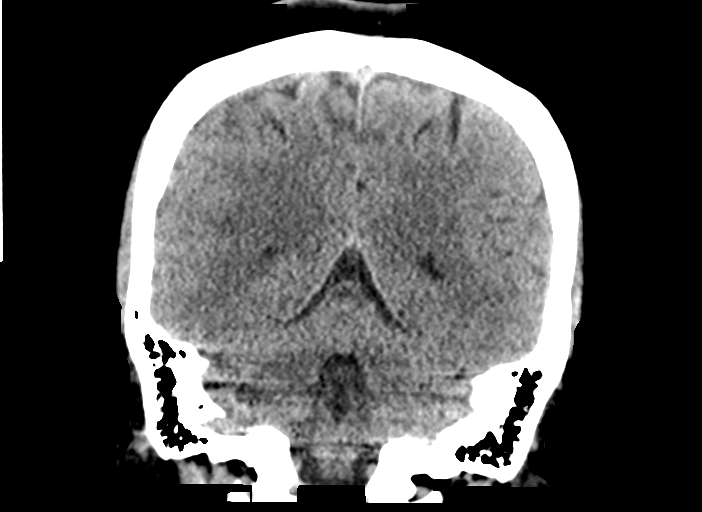
[im 28/62  brain]
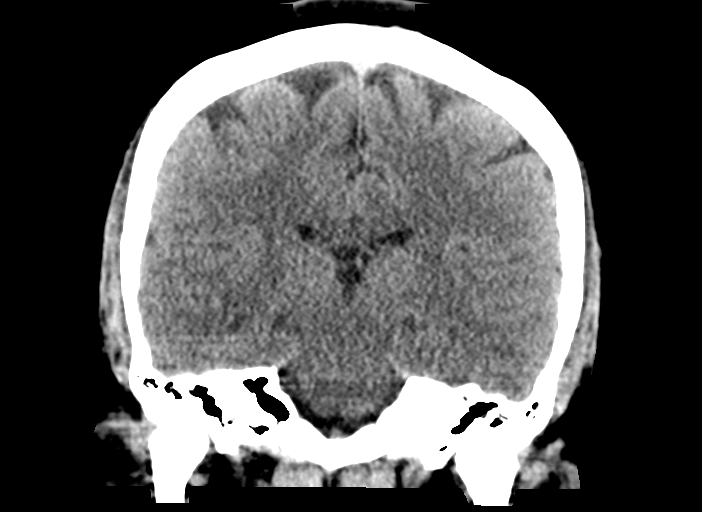
[im 34/62  brain]
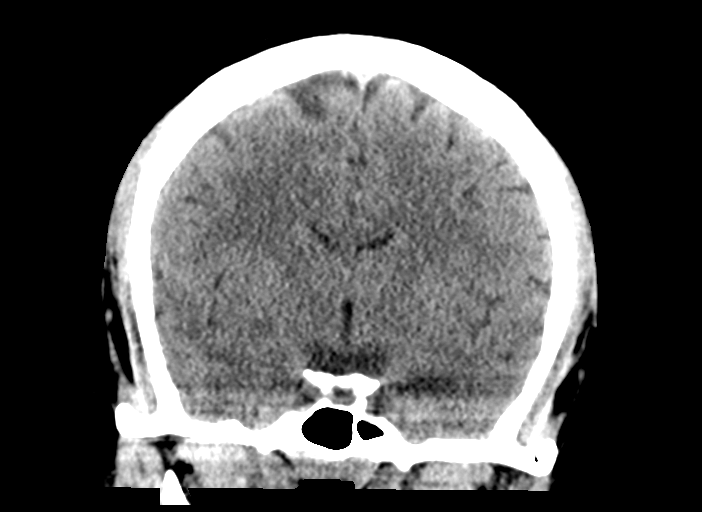

[Series 6: sagittal soft tissue · sagittal · 0.33mm/px · 3 of 50 slices shown]
[im 17/50  brain]
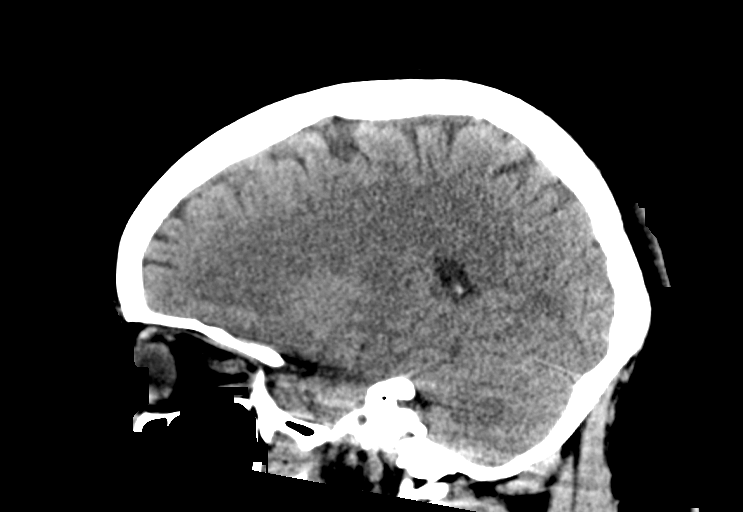
[im 25/50  brain]
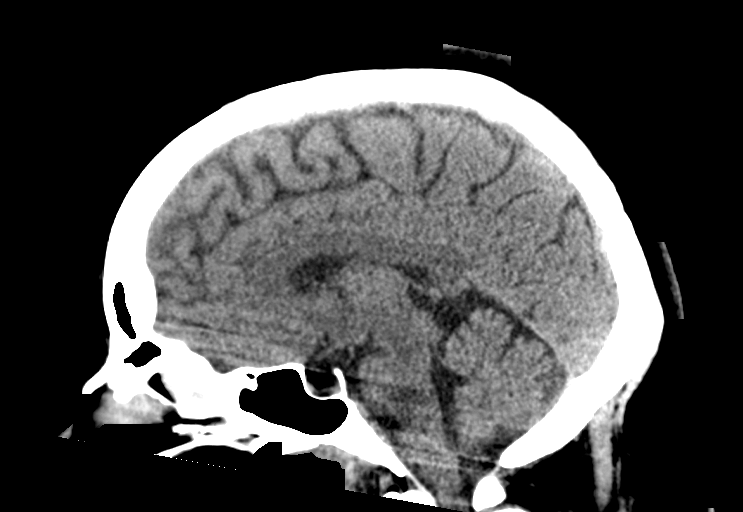
[im 33/50  brain]
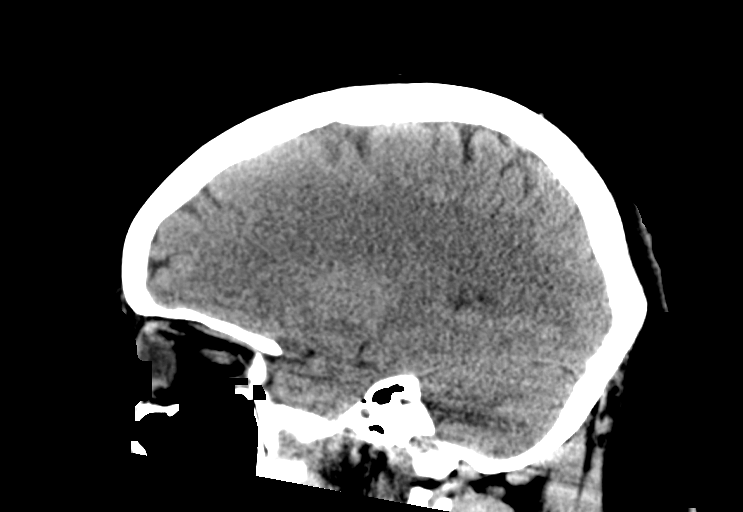

[14 of 47 positions shown; findings below may reference images not displayed]

FINDINGS: CT HEAD FINDINGS

Brain: No evidence of acute infarction, hemorrhage, hydrocephalus,
extra-axial collection or mass lesion/mass effect.

Vascular: No hyperdense vessel or unexpected calcification.

Skull: Normal. Negative for fracture or focal lesion.

Sinuses/Orbits: No acute finding.

Other: None.

CT CERVICAL SPINE FINDINGS

Alignment: There is reversal of the normal cervical lordotic
curvature.

Skull base and vertebrae: No acute fracture. No primary bone lesion
or focal pathologic process.

Soft tissues and spinal canal: No prevertebral fluid or swelling. No
visible canal hematoma.

Disc levels:  The disc heights are normal.

Upper chest: There is a partially visualized opacity in the left
upper lobe.

Other: None
IMPRESSION: 1. No acute intracranial abnormality.
2. No acute cervical spine fracture.
3. Partially visualized small airspace opacity in the left upper
lobe. This is of unknown clinical significance and could represent a
pulmonary contusion in the setting of trauma or developing
infiltrate.

## 2020-10-05 IMAGING — CT CT CERVICAL SPINE WITHOUT CONTRAST
3 of 4 series · 12 of 33 positions shown, 14 images · non-contrast
Comparison: None.

CLINICAL DATA: Head trauma.

EXAM:
CT HEAD WITHOUT CONTRAST
CT CERVICAL SPINE WITHOUT CONTRAST
TECHNIQUE: Multidetector CT imaging of the head and cervical spine was
performed following the standard protocol without intravenous
contrast. Multiplanar CT image reconstructions of the cervical spine
were also generated.

[Series 5: orthogonal bone · axial · 0.31mm/px · z∈[-322,-210]mm · 4 of 89 slices shown, 5 images]
[im 15/89  soft-tissue]
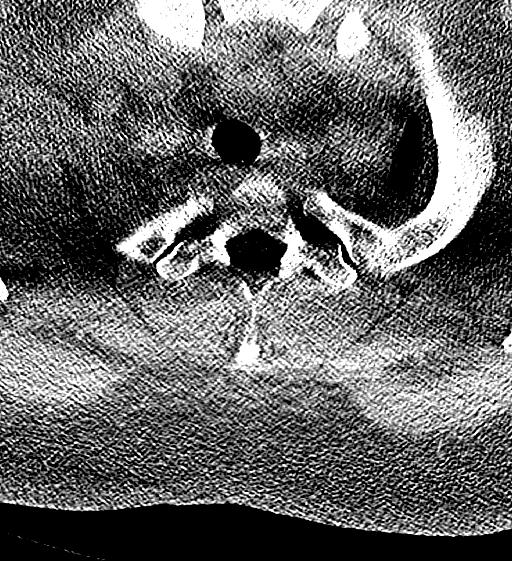
[im 15/89  bone]
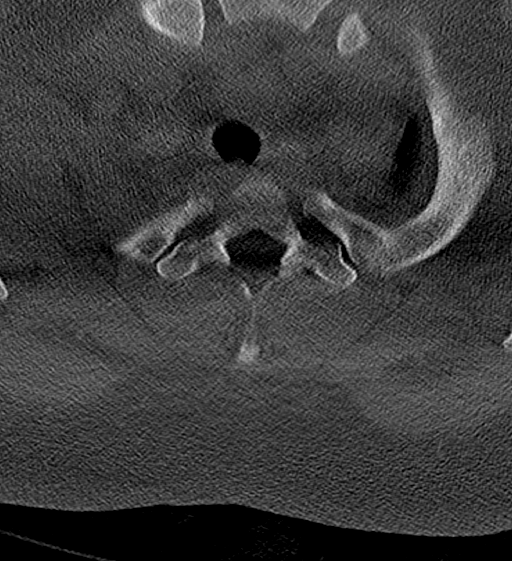
[im 30/89  bone]
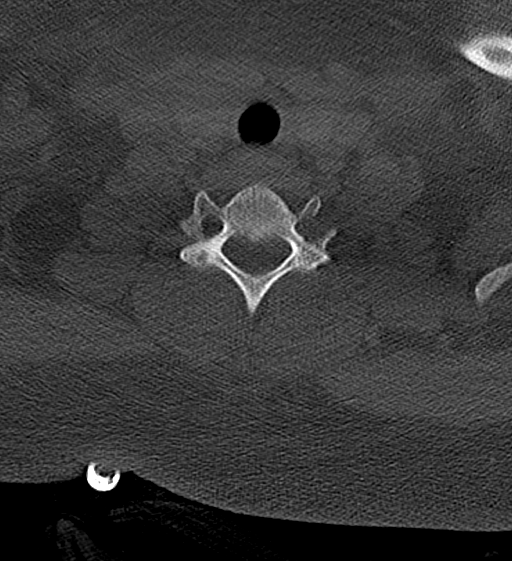
[im 59/89  bone]
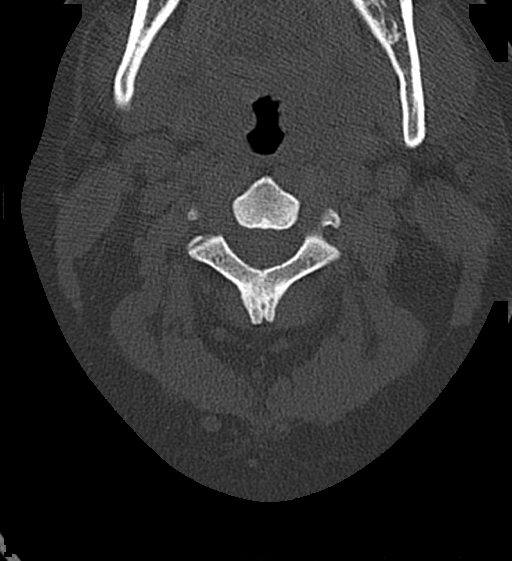
[im 74/89  bone]
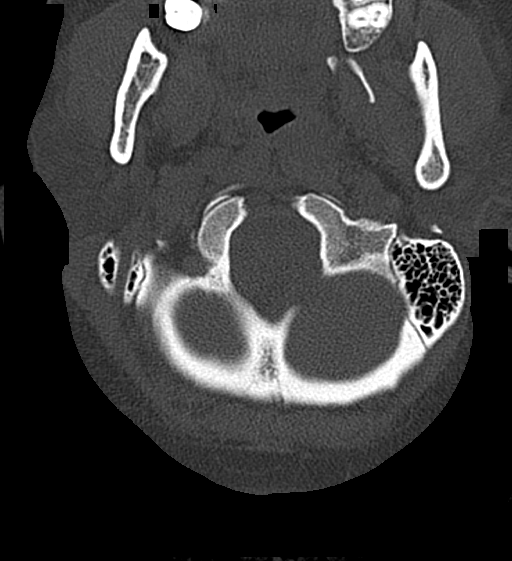

[Series 6: coronal bone · coronal · 0.22mm/px · 3 of 48 slices shown]
[im 10/48  bone]
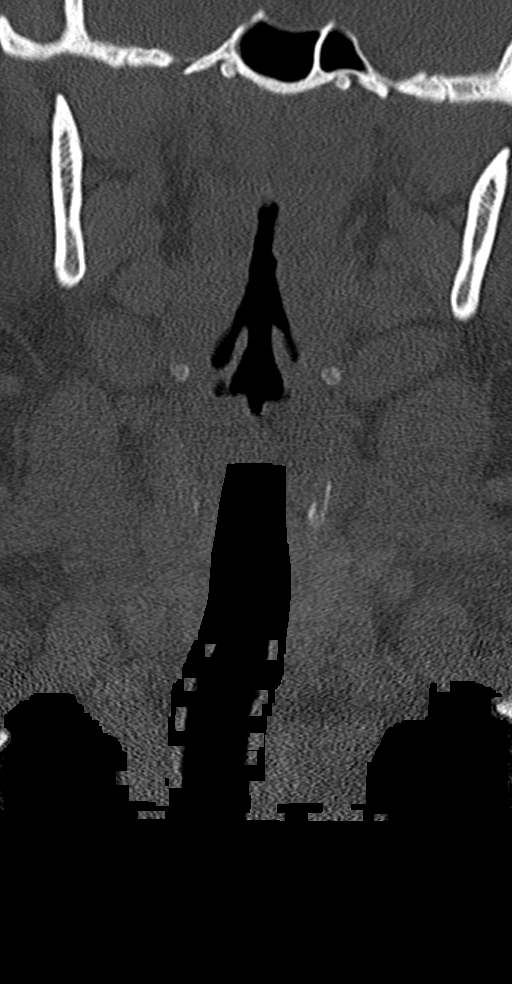
[im 19/48  bone]
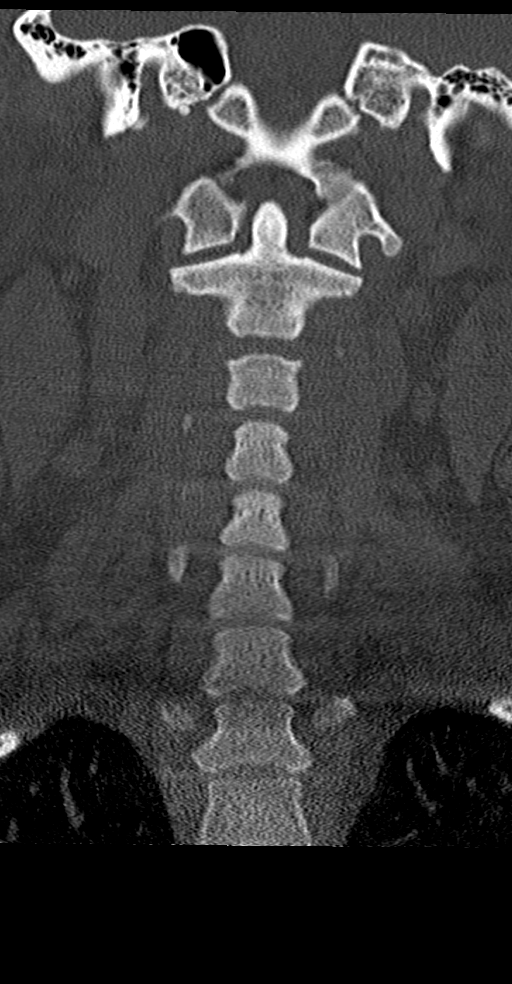
[im 29/48  bone]
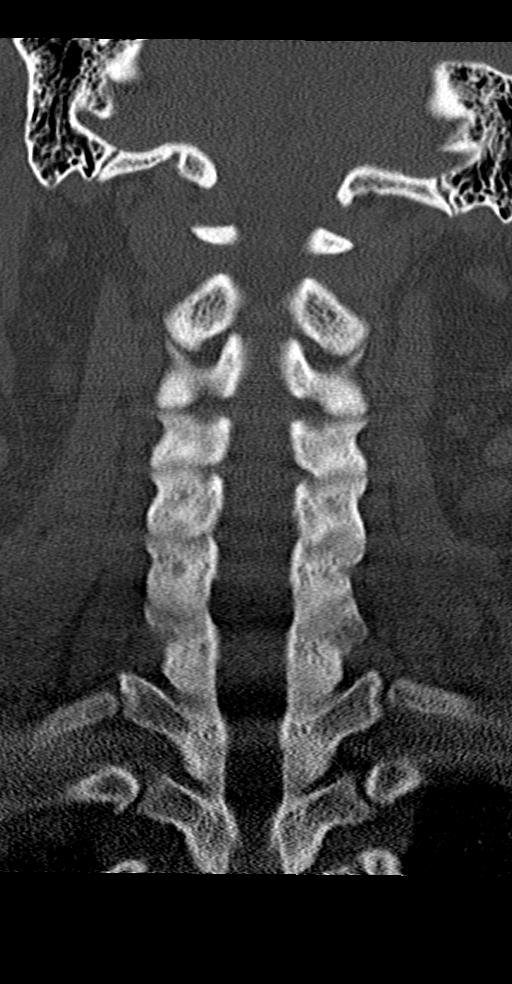

[Series 7: sagittal bone · sagittal · 0.36mm/px · 5 of 56 slices shown, 6 images]
[im 19/56  bone]
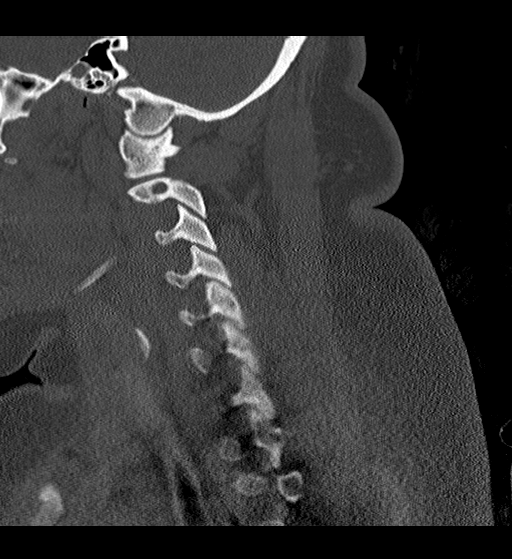
[im 23/56  bone]
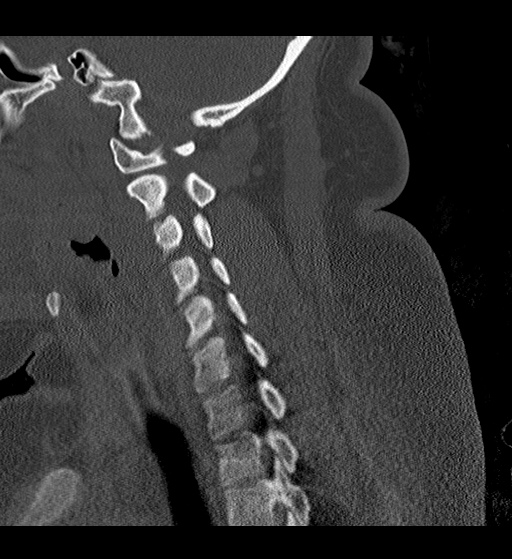
[im 28/56  soft-tissue]
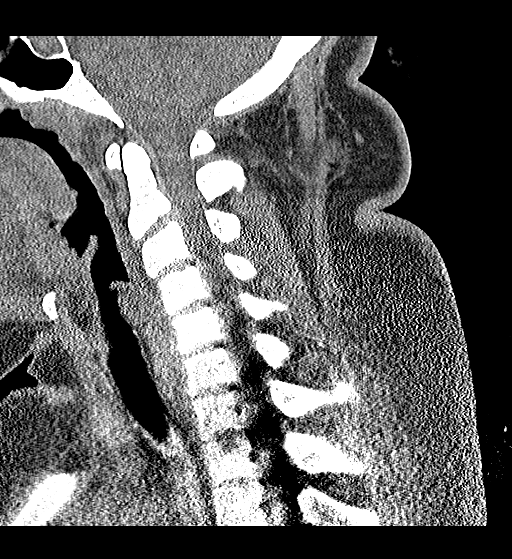
[im 28/56  bone]
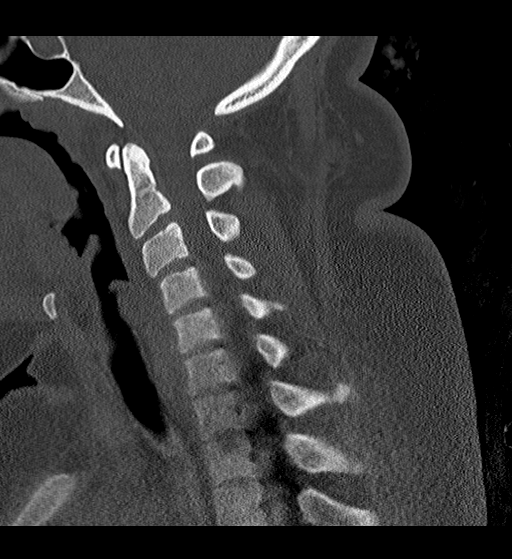
[im 33/56  bone]
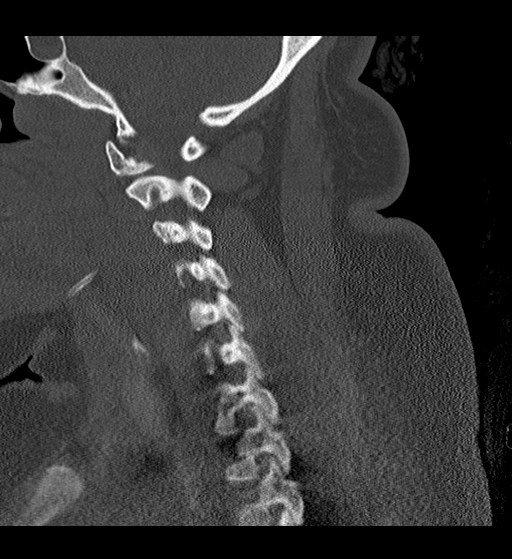
[im 37/56  bone]
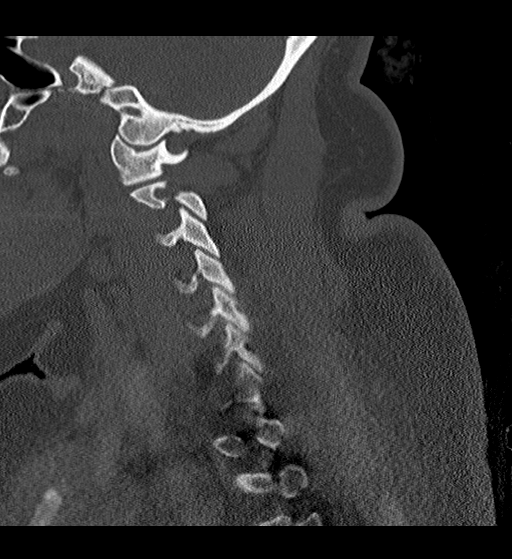

[12 of 33 positions shown; findings below may reference images not displayed]

FINDINGS: CT HEAD FINDINGS

Brain: No evidence of acute infarction, hemorrhage, hydrocephalus,
extra-axial collection or mass lesion/mass effect.

Vascular: No hyperdense vessel or unexpected calcification.

Skull: Normal. Negative for fracture or focal lesion.

Sinuses/Orbits: No acute finding.

Other: None.

CT CERVICAL SPINE FINDINGS

Alignment: There is reversal of the normal cervical lordotic
curvature.

Skull base and vertebrae: No acute fracture. No primary bone lesion
or focal pathologic process.

Soft tissues and spinal canal: No prevertebral fluid or swelling. No
visible canal hematoma.

Disc levels:  The disc heights are normal.

Upper chest: There is a partially visualized opacity in the left
upper lobe.

Other: None
IMPRESSION: 1. No acute intracranial abnormality.
2. No acute cervical spine fracture.
3. Partially visualized small airspace opacity in the left upper
lobe. This is of unknown clinical significance and could represent a
pulmonary contusion in the setting of trauma or developing
infiltrate.

## 2022-09-15 ENCOUNTER — Ambulatory Visit (HOSPITAL_COMMUNITY)
Admission: EM | Admit: 2022-09-15 | Discharge: 2022-09-15 | Disposition: A | Discharge status: Home / Self Care | Payer: Medicaid Other | Attending: Physician Assistant | Admitting: Physician Assistant

## 2022-09-15 ENCOUNTER — Encounter (HOSPITAL_COMMUNITY): Payer: Self-pay | Admitting: Emergency Medicine

## 2022-09-15 DIAGNOSIS — J069 Acute upper respiratory infection, unspecified: Secondary | ICD-10-CM | POA: Insufficient documentation

## 2022-09-15 DIAGNOSIS — F17210 Nicotine dependence, cigarettes, uncomplicated: Secondary | ICD-10-CM | POA: Insufficient documentation

## 2022-09-15 DIAGNOSIS — J029 Acute pharyngitis, unspecified: Secondary | ICD-10-CM | POA: Insufficient documentation

## 2022-09-15 DIAGNOSIS — Z8616 Personal history of COVID-19: Secondary | ICD-10-CM | POA: Insufficient documentation

## 2022-09-15 DIAGNOSIS — Z1152 Encounter for screening for COVID-19: Secondary | ICD-10-CM | POA: Insufficient documentation

## 2022-09-15 DIAGNOSIS — Z28310 Unvaccinated for covid-19: Secondary | ICD-10-CM | POA: Insufficient documentation

## 2022-09-15 LAB — SARS CORONAVIRUS 2 (TAT 6-24 HRS): SARS Coronavirus 2: NEGATIVE

## 2022-09-15 LAB — POCT RAPID STREP A, ED / UC: Streptococcus, Group A Screen (Direct): NEGATIVE

## 2022-09-15 MED ORDER — METHYLPREDNISOLONE ACETATE 80 MG/ML IJ SUSP
INTRAMUSCULAR | Status: AC
  Filled 2022-09-15: qty 1

## 2022-09-15 MED ORDER — LIDOCAINE VISCOUS HCL 2 % MT SOLN: 15.0000 mL | Freq: Four times a day (QID) | OROMUCOSAL | 0 refills | Status: AC | PRN

## 2022-09-15 MED ORDER — METHYLPREDNISOLONE ACETATE 80 MG/ML IJ SUSP
60.0000 mg | Freq: Once | INTRAMUSCULAR | Status: AC
  Administered 2022-09-15: 60 mg via INTRAMUSCULAR

## 2022-09-15 NOTE — Discharge Instructions (Addendum)
You tested negative for strep.  I am concerned that you have a virus.  We are testing you for COVID.  We will contact you if you are positive.  Monitor your MyChart for these results.  We gave you an injection of steroids to help with the swelling and discomfort today.  Use over-the-counter medications for additional symptom relief.  I have called in viscous lidocaine to help with your sore throat.  Swish and spit this every 6 hours as needed; do not eat or drink for approximately 20 minutes after using this as it increases your risk of choking.  Make sure you are resting and drinking plenty of fluid.  If your symptoms do not improving within a week return for reevaluation.  If you have any worsening symptoms including difficulty swallowing, change in your voice, shortness of breath, fever not respond to medication, nausea/vomiting interfere with oral intake you need to be seen immediately.

## 2022-09-15 NOTE — ED Provider Notes (Signed)
[facility]    CSN: [id number] Arrival date & time: 09/15/22  4627      History   Chief Complaint Chief Complaint  Patient presents with   Sore Throat   Cough    HPI Natalie Chavez is a 27 y.o. female.   Patient presents today with a 5-day history of URI symptoms including congestion, cough, sore throat.  Denies any fever, chest pain, shortness of breath, nausea, vomiting.  She reports that sore throat has worsened and is not responding to over-the-counter medications.  Currently pain is rated 10 on a 0-10 pain scale, described as sharp/stabbing, worse with swallowing, no alleviating factors notified.  She has tried TheraFlu, tea, honey, over-the-counter analgesics without improvement of symptoms.  Denies any recent antibiotics or steroids.  Denies any known sick contacts but has been visiting a family member in the hospital recently.  She has had COVID several years ago.  Has not had COVID-19 vaccinations.  She is a former smoker but quit several months ago.  Denies formal diagnosis of asthma or COPD.  She is confident that she is not pregnant.  She is able to eat and drink despite symptoms.  Denies any dysphagia or muffled voice.    Past Medical History:  Diagnosis Date   Depression    post partum depression for two weeks   Infection    UTI   Pre-eclampsia    Pregnancy induced hypertension    Urinary tract infection     Patient Active Problem List   Diagnosis Date Noted   H/O cesarean section 06/22/2013   History of pre-eclampsia 06/20/2013   Drug overdose 06/18/2013    Past Surgical History:  Procedure Laterality Date   CESAREAN SECTION N/A 06/21/2013   Procedure: CESAREAN SECTION;  Surgeon: Purcell Nails, MD;  Location: [facility];  Service: Obstetrics;  Laterality: N/A;   HERNIA REPAIR     THERAPEUTIC ABORTION      OB History     Gravida  2   Para  1   Term  1   Preterm      AB  1   Living  1      SAB      IAB  1   Ectopic       Multiple      Live Births  1            Home Medications    Prior to Admission medications   Medication Sig Start Date End Date Taking? Authorizing Provider  lidocaine (XYLOCAINE) 2 % solution Use as directed 15 mLs in the mouth or throat every 6 (six) hours as needed for mouth pain. Swish and spit 09/15/22  Yes Drayven Marchena K, PA-C  acetaminophen (TYLENOL) 325 MG tablet Take 650 mg by mouth daily as needed for headache.    [provider]    Family History Family History  Problem Relation Age of Onset   Diabetes Mother    Hyperlipidemia Mother    Asthma Mother    Hypertension Mother    Hyperlipidemia Father    Hypertension Father    Cancer Maternal Grandmother    Kidney disease Maternal Grandmother    Cancer Maternal Aunt        Rectal   Hypertension Maternal Aunt     Social History Social History   Tobacco Use   Smoking status: Every Day    Packs/day: 0.25    Years: 1.00    Total pack years: 0.25  Types: Cigarettes   Smokeless tobacco: Never  Substance Use Topics   Alcohol use: Yes    Comment: Occasional   Drug use: No     Allergies   Penicillins   Review of Systems Review of Systems  Constitutional:  Positive for activity change. Negative for appetite change, fatigue and fever.  HENT:  Positive for congestion, sore throat and trouble swallowing. Negative for sinus pressure, sneezing and voice change.   Respiratory:  Positive for cough. Negative for shortness of breath.   Cardiovascular:  Negative for chest pain.  Gastrointestinal:  Positive for diarrhea (chronic). Negative for abdominal pain, nausea and vomiting.  Neurological:  Negative for dizziness, light-headedness and headaches.     Physical Exam Triage Vital Signs ED Triage Vitals  Enc Vitals Group     BP 09/15/22 1005 125/88     Pulse Rate 09/15/22 1005 98     Resp 09/15/22 1005 17     Temp 09/15/22 1005 99.4 F (37.4 C)     Temp Source 09/15/22 1005 Oral     SpO2  09/15/22 1005 97 %     Weight --      Height --      Head Circumference --      Peak Flow --      Pain Score 09/15/22 1004 10     Pain Loc --      Pain Edu? --      Excl. in [place]? --    No data found.  Updated Vital Signs BP 125/88 (BP Location: Right Arm)   Pulse 98   Temp 99.4 F (37.4 C) (Oral)   Resp 17   LMP 08/29/2022   SpO2 97%   Visual Acuity Right Eye Distance:   Left Eye Distance:   Bilateral Distance:    Right Eye Near:   Left Eye Near:    Bilateral Near:     Physical Exam Vitals reviewed.  Constitutional:      General: She is awake. She is not in acute distress.    Appearance: Normal appearance. She is well-developed. She is not ill-appearing.     Comments: Very pleasant female appears stated age in no acute distress sitting comfortably in exam room  HENT:     Head: Normocephalic and atraumatic.     Right Ear: Tympanic membrane, ear canal and external ear normal. Tympanic membrane is not erythematous or bulging.     Left Ear: Tympanic membrane, ear canal and external ear normal. Tympanic membrane is not erythematous or bulging.     Nose:     Right Sinus: No maxillary sinus tenderness or frontal sinus tenderness.     Left Sinus: No maxillary sinus tenderness or frontal sinus tenderness.     Mouth/Throat:     Pharynx: Uvula midline. Posterior oropharyngeal erythema present. No oropharyngeal exudate.     Tonsils: No tonsillar exudate or tonsillar abscesses.  Cardiovascular:     Rate and Rhythm: Normal rate and regular rhythm.     Heart sounds: Normal heart sounds, S1 normal and S2 normal. No murmur heard. Pulmonary:     Effort: Pulmonary effort is normal.     Breath sounds: Normal breath sounds. No wheezing, rhonchi or rales.     Comments: Clear to auscultation bilaterally Lymphadenopathy:     Head:     Right side of head: No submental, submandibular or tonsillar adenopathy.     Left side of head: No submental, submandibular or tonsillar adenopathy.  Cervical: No cervical adenopathy.  Psychiatric:        Behavior: Behavior is cooperative.      UC Treatments / Results  Labs (all labs ordered are listed, but only abnormal results are displayed) Labs Reviewed  CULTURE, GROUP A STREP ([place])  SARS CORONAVIRUS 2 (TAT 6-24 HRS)  POCT RAPID STREP A, ED / UC    EKG   Radiology No results found.  Procedures Procedures (including critical care time)  Medications Ordered in UC Medications  methylPREDNISolone acetate (DEPO-MEDROL) injection 60 mg (has no administration in time range)    Initial Impression / Assessment and Plan / UC Course  I have reviewed the triage vital signs and the nursing notes.  Pertinent labs & imaging results that were available during my care of the patient were reviewed by me and considered in my medical decision making (see chart for details).     Patient is well-appearing, afebrile, nontoxic, nontachycardic.  Strep testing was obtained and was negative.  Throat culture is pending.  Will defer antibiotics until culture results are available.  Discussed likely viral etiology of symptoms.  She is already been symptomatic for several days and so not a candidate for Tamiflu therefore influenza testing was deferred.  COVID testing was obtained and is pending.  By the time we have her results she will be outside the window of effectiveness for antivirals and is otherwise young and healthy so not a candidate for these medications.  She was given Depo-Medrol to help with swelling and inflammation.  Recommended that she use over-the-counter medication for additional symptom relief.  She was prescribed viscous lidocaine to help with symptoms and we discussed that she should not eat or drink immediately after using this medication as it increases the risk of choking.  She is to rest and drink plenty of fluid.  Discussed that if symptoms or not improving within a week she is to return for reevaluation.  If she has any  worsening symptoms including high fever not responding to medication, chest pain, shortness of breath, dysphagia, muffled voice, nausea/vomiting interfering with oral intake she needs to be seen immediately to which she expressed understanding.  Strict return precautions given.  Work excuse note with current CDC return to work guidelines based on COVID test result provided during visit today.  Final Clinical Impressions(s) / UC Diagnoses   Final diagnoses:  Upper respiratory tract infection, unspecified type  Sore throat     Discharge Instructions      You tested negative for strep.  I am concerned that you have a virus.  We are testing you for COVID.  We will contact you if you are positive.  Monitor your MyChart for these results.  We gave you an injection of steroids to help with the swelling and discomfort today.  Use over-the-counter medications for additional symptom relief.  I have called in viscous lidocaine to help with your sore throat.  Swish and spit this every 6 hours as needed; do not eat or drink for approximately 20 minutes after using this as it increases your risk of choking.  Make sure you are resting and drinking plenty of fluid.  If your symptoms do not improving within a week return for reevaluation.  If you have any worsening symptoms including difficulty swallowing, change in your voice, shortness of breath, fever not respond to medication, nausea/vomiting interfere with oral intake you need to be seen immediately.     ED Prescriptions     Medication Sig  Dispense Auth. Provider   lidocaine (XYLOCAINE) 2 % solution Use as directed 15 mLs in the mouth or throat every 6 (six) hours as needed for mouth pain. Swish and spit 100 mL Makayela Secrest K, PA-C      PDMP not reviewed this encounter.   Terrilee Croak, PA-C 09/15/22 1108

## 2022-09-15 NOTE — ED Triage Notes (Signed)
Pt reports congestion, cough and sore throat for about 5 days. Tried home remedies without relief.

## 2022-09-18 LAB — CULTURE, GROUP A STREP (THRC)

## 2023-08-03 DIAGNOSIS — R059 Cough, unspecified: Secondary | ICD-10-CM | POA: Diagnosis not present

## 2023-08-03 DIAGNOSIS — Z20822 Contact with and (suspected) exposure to covid-19: Secondary | ICD-10-CM | POA: Diagnosis not present

## 2023-08-03 DIAGNOSIS — J101 Influenza due to other identified influenza virus with other respiratory manifestations: Secondary | ICD-10-CM | POA: Diagnosis not present
